# Patient Record
Sex: Male | Born: 1975 | Race: Black or African American | Hispanic: No | Marital: Single
Health system: Midwestern US, Community
[De-identification: ages and names within clinical notes are randomized; demographics above are authoritative.]

---

## 2012-12-30 HISTORY — PX: STOMACH SURGERY: SHX791

## 2015-03-29 ENCOUNTER — Inpatient Hospital Stay: Admit: 2015-03-29 | Primary: Family Medicine

## 2015-03-29 LAB — METABOLIC PANEL, COMPREHENSIVE
A-G Ratio: 1.4 (ref 1.0–3.1)
ALT (SGPT): 29 U/L (ref 12.0–78.0)
AST (SGOT): 44 U/L — ABNORMAL HIGH (ref 15–37)
Albumin: 4.4 g/dL (ref 3.40–5.00)
Alk. phosphatase: 98 U/L (ref 46–116)
Anion gap: 18 mmol/L — ABNORMAL HIGH (ref 10–17)
BUN/Creatinine ratio: 15 (ref 6.0–20.0)
BUN: 17 MG/DL (ref 7–18)
Bilirubin, total: 1.7 MG/DL — ABNORMAL HIGH (ref 0.20–1.00)
CO2: 26 mmol/L (ref 21–32)
Calcium: 9.1 MG/DL (ref 8.5–10.1)
Chloride: 104 mmol/L (ref 98–107)
Creatinine: 1.15 MG/DL (ref 0.6–1.3)
GFR est AA: >60 mL/min/{1.73_m2} (ref >60)
GFR est non-AA: >60 mL/min/{1.73_m2} (ref >60)
Globulin: 3.2 g/dL
Glucose: 154 mg/dL — ABNORMAL HIGH (ref 74–106)
Potassium: 3.1 mmol/L — ABNORMAL LOW (ref 3.50–5.10)
Protein, total: 7.6 g/dL (ref 6.40–8.20)
Sodium: 145 mmol/L (ref 136–145)

## 2015-03-29 LAB — CBC WITH AUTOMATED DIFF
ABS. BASOPHILS: 0 10*3/uL (ref 0.0–0.2)
ABS. EOSINOPHILS: 0 10*3/uL (ref 0.0–0.7)
ABS. LYMPHOCYTES: 0.7 10*3/uL — ABNORMAL LOW (ref 1.2–3.4)
ABS. MONOCYTES: 0.7 10*3/uL — ABNORMAL LOW (ref 1.1–3.2)
ABS. NEUTROPHILS: 7.5 10*3/uL — ABNORMAL HIGH (ref 1.4–6.5)
BASOPHILS: 0 % (ref 0–2)
EOSINOPHILS: 0 % (ref 0–5)
HCT: 47.1 % — ABNORMAL HIGH (ref 36.8–45.2)
HGB: 16.6 g/dL — ABNORMAL HIGH (ref 12.8–15.0)
IMMATURE GRANULOCYTES: 0.2 % (ref 0.0–5.0)
LYMPHOCYTES: 8 % — ABNORMAL LOW (ref 16–40)
MCH: 29.5 PG (ref 27–31)
MCHC: 35.2 g/dL (ref 32–36)
MCV: 83.8 FL (ref 81–99)
MONOCYTES: 8 % (ref 0–12)
MPV: 11.2 FL — ABNORMAL HIGH (ref 7.4–10.4)
NEUTROPHILS: 84 % — ABNORMAL HIGH (ref 40–70)
PLATELET: 247 10*3/uL (ref 140–450)
RBC: 5.62 M/uL — ABNORMAL HIGH (ref 4.0–5.2)
RDW: 13.5 % (ref 11.5–14.5)
WBC: 9 10*3/uL (ref 4.8–10.8)

## 2015-03-29 LAB — AMYLASE: Amylase: 67 U/L (ref 25–115)

## 2015-03-29 LAB — LIPASE: Lipase: 119 U/L (ref 65–230)

## 2016-01-17 ENCOUNTER — Emergency Department (HOSPITAL_COMMUNITY): Payer: Self-pay

## 2016-01-17 ENCOUNTER — Emergency Department (HOSPITAL_COMMUNITY)
Admission: EM | Admit: 2016-01-17 | Discharge: 2016-01-18 | Disposition: A | Discharge status: Home / Self Care | Payer: Self-pay | Attending: Emergency Medicine | Admitting: Emergency Medicine

## 2016-01-17 ENCOUNTER — Encounter (HOSPITAL_COMMUNITY): Payer: Self-pay | Admitting: Emergency Medicine

## 2016-01-17 DIAGNOSIS — R112 Nausea with vomiting, unspecified: Secondary | ICD-10-CM | POA: Insufficient documentation

## 2016-01-17 DIAGNOSIS — Z9889 Other specified postprocedural states: Secondary | ICD-10-CM | POA: Insufficient documentation

## 2016-01-17 DIAGNOSIS — R1084 Generalized abdominal pain: Secondary | ICD-10-CM | POA: Insufficient documentation

## 2016-01-17 DIAGNOSIS — R197 Diarrhea, unspecified: Secondary | ICD-10-CM | POA: Insufficient documentation

## 2016-01-17 LAB — CBC
HEMATOCRIT: 41.3 % (ref 39.0–52.0)
HEMOGLOBIN: 14.1 g/dL (ref 13.0–17.0)
MCH: 29.1 pg (ref 26.0–34.0)
MCHC: 34.1 g/dL (ref 30.0–36.0)
MCV: 85.2 fL (ref 78.0–100.0)
PLATELETS: 205 10*3/uL (ref 150–400)
RBC: 4.85 MIL/uL (ref 4.22–5.81)
RDW: 14.4 % (ref 11.5–15.5)
WBC: 8.5 10*3/uL (ref 4.0–10.5)

## 2016-01-17 LAB — COMPREHENSIVE METABOLIC PANEL
ALT: 17 U/L (ref 17–63)
ANION GAP: 11 (ref 5–15)
AST: 17 U/L (ref 15–41)
Albumin: 3.9 g/dL (ref 3.5–5.0)
Alkaline Phosphatase: 71 U/L (ref 38–126)
BUN: 10 mg/dL (ref 6–20)
CHLORIDE: 104 mmol/L (ref 101–111)
CO2: 26 mmol/L (ref 22–32)
CREATININE: 1.13 mg/dL (ref 0.61–1.24)
Calcium: 9.3 mg/dL (ref 8.9–10.3)
Glucose, Bld: 162 mg/dL — ABNORMAL HIGH (ref 65–99)
POTASSIUM: 3.5 mmol/L (ref 3.5–5.1)
SODIUM: 141 mmol/L (ref 135–145)
Total Bilirubin: 1 mg/dL (ref 0.3–1.2)
Total Protein: 6.8 g/dL (ref 6.5–8.1)

## 2016-01-17 LAB — LIPASE, BLOOD: LIPASE: 23 U/L (ref 11–51)

## 2016-01-17 MED ORDER — FAMOTIDINE IN NACL 20-0.9 MG/50ML-% IV SOLN
20.0000 mg | Freq: Once | INTRAVENOUS | Status: AC
  Administered 2016-01-17: 20 mg via INTRAVENOUS
  Filled 2016-01-17: qty 50

## 2016-01-17 MED ORDER — DICYCLOMINE HCL 10 MG/ML IM SOLN
20.0000 mg | Freq: Once | INTRAMUSCULAR | Status: AC
  Administered 2016-01-17: 20 mg via INTRAMUSCULAR
  Filled 2016-01-17: qty 2

## 2016-01-17 MED ORDER — PROMETHAZINE HCL 25 MG/ML IJ SOLN
12.5000 mg | Freq: Once | INTRAMUSCULAR | Status: AC
  Administered 2016-01-18: 12.5 mg via INTRAVENOUS
  Filled 2016-01-17: qty 1

## 2016-01-17 MED ORDER — SODIUM CHLORIDE 0.9 % IV BOLUS (SEPSIS)
1000.0000 mL | Freq: Once | INTRAVENOUS | Status: AC
  Administered 2016-01-17: 1000 mL via INTRAVENOUS

## 2016-01-17 MED ORDER — ONDANSETRON 4 MG PO TBDP: 4.0000 mg | ORAL_TABLET | Freq: Once | ORAL | Status: DC | PRN

## 2016-01-17 MED ORDER — FENTANYL CITRATE (PF) 100 MCG/2ML IJ SOLN
50.0000 ug | INTRAMUSCULAR | Status: AC | PRN
  Administered 2016-01-17 – 2016-01-18 (×2): 50 ug via INTRAVENOUS
  Filled 2016-01-17 (×2): qty 2

## 2016-01-17 MED ORDER — ONDANSETRON HCL 4 MG/2ML IJ SOLN
4.0000 mg | INTRAMUSCULAR | Status: DC | PRN
  Administered 2016-01-17: 4 mg via INTRAVENOUS
  Filled 2016-01-17: qty 2

## 2016-01-17 NOTE — ED Notes (Signed)
Dr. Mcmanus at the bedside.  

## 2016-01-17 NOTE — ED Provider Notes (Signed)
CSN: [id number]     Arrival date & time 01/17/16  2212 History   First MD Initiated Contact with Patient 01/17/16 2213     Chief Complaint  Patient presents with  . Abdominal Pain      HPI Pt was seen at 2215.  Per pt, c/o gradual onset and persistence of constant generalized abd "pain" since this morning.  Has been associated with multiple intermittent episodes of N/V/D.  Describes the abd pain as "cramping."  Denies fevers, no back pain, no rash, no CP/SOB, no black or blood in stools or emesis.      History reviewed. No pertinent past medical history.   Past Surgical History  Procedure Laterality Date  . Stomach surgery  2014    patient is unsure what kind    Social History  Substance Use Topics  . Smoking status: Never Smoker   . Smokeless tobacco: Never Used  . Alcohol Use: No    Review of Systems ROS: Statement: All systems negative except as marked or noted in the HPI; Constitutional: Negative for fever and chills. ; ; Eyes: Negative for eye pain, redness and discharge. ; ; ENMT: Negative for ear pain, hoarseness, nasal congestion, sinus pressure and sore throat. ; ; Cardiovascular: Negative for chest pain, palpitations, diaphoresis, dyspnea and peripheral edema. ; ; Respiratory: Negative for cough, wheezing and stridor. ; ; Gastrointestinal: +N/V/D, abd pain. Negative for blood in stool, hematemesis, jaundice and rectal bleeding. . ; ; Genitourinary: Negative for dysuria, flank pain and hematuria. ; ; Musculoskeletal: Negative for back pain and neck pain. Negative for swelling and trauma.; ; Skin: Negative for pruritus, rash, abrasions, blisters, bruising and skin lesion.; ; Neuro: Negative for headache, lightheadedness and neck stiffness. Negative for weakness, altered level of consciousness , altered mental status, extremity weakness, paresthesias, involuntary movement, seizure and syncope.      Allergies  Review of patient's allergies indicates no known allergies.  Home  Medications   Prior to Admission medications   Not on File   BP 151/87 mmHg  Pulse 76  Temp(Src) 98.4 F (36.9 C)  Resp 16  Ht  (1.905 m)  Wt 175 lb (79.379 kg)  BMI 21.87 kg/m2  SpO2 100% Physical Exam  2220: Physical examination:  Nursing notes reviewed; Vital signs and O2 SAT reviewed;  Constitutional: Well developed, Well nourished, Uncomfortable appearing.; Head:  Normocephalic, atraumatic; Eyes: EOMI, PERRL, No scleral icterus; ENMT: Mouth and pharynx normal, Mucous membranes dry; Neck: Supple, Full range of motion, No lymphadenopathy; Cardiovascular: Regular rate and rhythm, No gallop; Respiratory: Breath sounds clear & equal bilaterally, No wheezes.  Speaking full sentences with ease, Normal respiratory effort/excursion; Chest: Nontender, Movement normal; Abdomen: Soft, +dry heaving into emesis bag during exam. +diffuse tenderness to palp. No rebound or guarding. Nondistended, Normal bowel sounds; Genitourinary: No CVA tenderness; Extremities: Pulses normal, No tenderness, No edema, No calf edema or asymmetry.; Neuro: AA&Ox3, Major CN grossly intact.  Speech clear. No gross focal motor or sensory deficits in extremities.; Skin: Color normal, Warm, Dry.   ED Course  Procedures (including critical care time)  Labs Review Imaging Review  I have personally reviewed and evaluated these images and lab results as part of my medical decision-making.   EKG Interpretation None      MDM  MDM Reviewed: nursing note and vitals Interpretation: labs, x-ray and CT scan     Results for orders placed or performed during the hospital encounter of 01/17/16  Lipase, blood  Result Value  Ref Range   Lipase 23 11 - 51 U/L  Comprehensive metabolic panel  Result Value Ref Range   Sodium 141 135 - 145 mmol/L   Potassium 3.5 3.5 - 5.1 mmol/L   Chloride 104 101 - 111 mmol/L   CO2 26 22 - 32 mmol/L   Glucose, Bld 162 (H) 65 - 99 mg/dL   BUN 10 6 - 20 mg/dL   Creatinine, Ser 4.54  0.61 - 1.24 mg/dL   Calcium 9.3 8.9 - 09.8 mg/dL   Total Protein 6.8 6.5 - 8.1 g/dL   Albumin 3.9 3.5 - 5.0 g/dL   AST 17 15 - 41 U/L   ALT 17 17 - 63 U/L   Alkaline Phosphatase 71 38 - 126 U/L   Total Bilirubin 1.0 0.3 - 1.2 mg/dL   GFR calc non Af Amer >60 >60 mL/min   GFR calc Af Amer >60 >60 mL/min   Anion gap 11 5 - 15  CBC  Result Value Ref Range   WBC 8.5 4.0 - 10.5 K/uL   RBC 4.85 4.22 - 5.81 MIL/uL   Hemoglobin 14.1 13.0 - 17.0 g/dL   HCT 11.9 14.7 - 82.9 %   MCV 85.2 78.0 - 100.0 fL   MCH 29.1 26.0 - 34.0 pg   MCHC 34.1 30.0 - 36.0 g/dL   RDW 56.2 13.0 - 86.5 %   Platelets 205 150 - 400 K/uL   Dg Chest 2 View 01/17/2016  CLINICAL DATA:  40 year old male with abdominal pain. No chest complaint. EXAM: CHEST  2 VIEW COMPARISON:  None. FINDINGS: The heart size and mediastinal contours are within normal limits. Both lungs are clear. The visualized skeletal structures are unremarkable. IMPRESSION: No active cardiopulmonary disease. Electronically Signed   By: Elgie Collard M.D.   On: 01/17/2016 23:13   Ct Abdomen Pelvis W Contrast 01/18/2016  CLINICAL DATA:  40 year old male with nausea and vomiting and chills. Diffuse abdominal pain. EXAM: CT ABDOMEN AND PELVIS WITH CONTRAST TECHNIQUE: Multidetector CT imaging of the abdomen and pelvis was performed using the standard protocol following bolus administration of intravenous contrast. CONTRAST:  OMNIPAQUE IOHEXOL 300 MG/ML  SOLN COMPARISON:  None FINDINGS: Evaluation of this exam is limited due to respiratory motion artifact. The visualized lung bases are clear. No intra-abdominal free air or free fluid. The liver, gallbladder, pancreas, spleen, adrenal glands, kidneys, visualized ureters, and urinary bladder appear unremarkable. The prostate and seminal vesicles are grossly unremarkable. Loose stool noted within the colon compatible with diarrheal state. Correlation with clinical exam and stool cultures recommended. Multiple  normal caliber fluid-filled loops of small bowel noted which may be physiologic or represent enteritis. No evidence of bowel obstruction. Normal appendix. The abdominal aorta and IVC appear unremarkable. No portal venous gas identified. There is no adenopathy. The abdominal wall soft tissues and the osseous structures appear unremarkable. IMPRESSION: Possible enteritis with diarrheal state. Correlation with clinical exam and stool cultures recommended. No evidence of bowel obstruction. Normal appendix. Electronically Signed   By: Elgie Collard M.D.   On: 01/18/2016 00:35    0050:  Pt has continued to dry heave despite IV zofran. IV phenergan given, and pt is now sleeping, appears comfortable. No stooling while in the ED. VS remain stable. Pending UA and PO trial. Sign out to PA Neva Seat.   Samuel Jester, DO 01/20/16 2330

## 2016-01-17 NOTE — ED Notes (Signed)
Explained to patient nausea medication comes from pharmacy. Prepared to go to ct scan

## 2016-01-17 NOTE — ED Notes (Signed)
Per EMS, reports of nausea, vomiting, diarrhea, and cramping in abdomen. Chills, and diaphoretic. 18g in left arm.  zofran given by ems. Stomach surgery in 2014, unclear of what type. VS with ems bp 142/80, p 64, 96% o2 on room air,cbg 141.

## 2016-01-18 ENCOUNTER — Encounter (HOSPITAL_COMMUNITY): Payer: Self-pay

## 2016-01-18 LAB — URINALYSIS, ROUTINE W REFLEX MICROSCOPIC
Bilirubin Urine: NEGATIVE
Glucose, UA: 100 mg/dL — AB
HGB URINE DIPSTICK: NEGATIVE
KETONES UR: NEGATIVE mg/dL
LEUKOCYTES UA: NEGATIVE
Nitrite: NEGATIVE
PROTEIN: NEGATIVE mg/dL
Specific Gravity, Urine: 1.042 — ABNORMAL HIGH (ref 1.005–1.030)
pH: 7 (ref 5.0–8.0)

## 2016-01-18 MED ORDER — HYDROCODONE-ACETAMINOPHEN 5-325 MG PO TABS: 1.0000 | ORAL_TABLET | ORAL | Status: AC | PRN

## 2016-01-18 MED ORDER — IOHEXOL 300 MG/ML  SOLN
100.0000 mL | Freq: Once | INTRAMUSCULAR | Status: AC | PRN
  Administered 2016-01-18: 100 mL via INTRAVENOUS

## 2016-01-18 MED ORDER — ONDANSETRON HCL 4 MG PO TABS: 4.0000 mg | ORAL_TABLET | Freq: Four times a day (QID) | ORAL | Status: AC

## 2016-01-18 NOTE — ED Provider Notes (Signed)
Patient signout from Dr. Clarene Duke, he came in today with complex of nausea, vomiting, diarrhea and cramping to his abdomen. He's had a negative urinalysis, negative lipase, normal CMP and CBC as well as a negative chest x-ray and a CT abdomen pelvis showing enteritis.  The patient was planning of nausea still after the IV Zofran and therefore given a dose of IV Phenergan which made him very sleepy. Therefore the patient was observed until more awake. After the patient became less drowsy attempted to have him by mouth challenge twice but he said he was nauseous. He said that he fell acutely to vomit, attempted hand him the vomit back but instead he he acted like he is given a vomit on the floor instead, I tempted to give it a vomit bag 2 but instead he got on all fours on the ground to vomit. There was only dry heaving and did not see any vomitus.  I allowed the patient to rest for another hour and asked the nurse to attempt to fluid challenge him, the patient does not want to participate in the fluid challenge, has pulled off all of his equipment. At this time he will be discharged with referral to gastroenterology, pain and nausea medicine. He has not had any episodes of stool while in the ED today and has not had any episodes of vomiting per nurse in hours.  Marlon Pel, PA-C 01/18/16 0248  Samuel Jester, DO 01/20/16 2330

## 2016-01-18 NOTE — Discharge Instructions (Signed)
Diarrhea °Diarrhea is frequent loose and watery bowel movements. It can cause you to feel weak and dehydrated. Dehydration can cause you to become tired and thirsty, have a dry mouth, and have decreased urination that often is dark yellow. Diarrhea is a sign of another problem, most often an infection that will not last long. In most cases, diarrhea typically lasts 2-3 days. However, it can last longer if it is a sign of something more serious. It is important to treat your diarrhea as directed by your caregiver to lessen or prevent future episodes of diarrhea. °CAUSES  °Some common causes include: °· Gastrointestinal infections caused by viruses, bacteria, or parasites. °· Food poisoning or food allergies. °· Certain medicines, such as antibiotics, chemotherapy, and laxatives. °· Artificial sweeteners and fructose. °· Digestive disorders. °HOME CARE INSTRUCTIONS °· Ensure adequate fluid intake (hydration): Have 1 cup (8 oz) of fluid for each diarrhea episode. Avoid fluids that contain simple sugars or sports drinks, fruit juices, whole milk products, and sodas. Your urine should be clear or pale yellow if you are drinking enough fluids. Hydrate with an oral rehydration solution that you can purchase at pharmacies, retail stores, and online. You can prepare an oral rehydration solution at home by mixing the following ingredients together: °¨  - tsp table salt. °¨ ¾ tsp baking soda. °¨  tsp salt substitute containing potassium chloride. °¨ 1  tablespoons sugar. °¨ 1 L (34 oz) of water. °· Certain foods and beverages may increase the speed at which food moves through the gastrointestinal (GI) tract. These foods and beverages should be avoided and include: °¨ Caffeinated and alcoholic beverages. °¨ High-fiber foods, such as raw fruits and vegetables, nuts, seeds, and whole grain breads and cereals. °¨ Foods and beverages sweetened with sugar alcohols, such as xylitol, sorbitol, and mannitol. °· Some foods may be well  tolerated and may help thicken stool including: °¨ Starchy foods, such as rice, toast, pasta, low-sugar cereal, oatmeal, grits, baked potatoes, crackers, and bagels. °¨ Bananas. °¨ Applesauce. °· Add probiotic-rich foods to help increase healthy bacteria in the GI tract, such as yogurt and fermented milk products. °· Wash your hands well after each diarrhea episode. °· Only take over-the-counter or prescription medicines as directed by your caregiver. °· Take a warm bath to relieve any burning or pain from frequent diarrhea episodes. °SEEK IMMEDIATE MEDICAL CARE IF:  °· You are unable to keep fluids down. °· You have persistent vomiting. °· You have blood in your stool, or your stools are black and tarry. °· You do not urinate in 6-8 hours, or there is only a small amount of very dark urine. °· You have abdominal pain that increases or localizes. °· You have weakness, dizziness, confusion, or light-headedness. °· You have a severe headache. °· Your diarrhea gets worse or does not get better. °· You have a fever or persistent symptoms for more than 2-3 days. °· You have a fever and your symptoms suddenly get worse. °MAKE SURE YOU:  °· Understand these instructions. °· Will watch your condition. °· Will get help right away if you are not doing well or get worse. °  °This information is not intended to replace advice given to you by your health care provider. Make sure you discuss any questions you have with your health care provider. °  °Document Released: 12/06/2002 Document Revised: 01/06/2015 Document Reviewed: 08/23/2012 °Elsevier Interactive Patient Education ©2016 [employer] °Abdominal Pain, Adult °Many things can cause abdominal pain. Usually, abdominal pain   is not caused by a disease and will improve without treatment. It can often be observed and treated at home. Your health care provider will do a physical exam and possibly order blood tests and X-rays to help determine the seriousness of your pain.  However, in many cases, more time must pass before a clear cause of the pain can be found. Before that point, your health care provider may not know if you need more testing or further treatment. °HOME CARE INSTRUCTIONS °Monitor your abdominal pain for any changes. The following actions may help to alleviate any discomfort you are experiencing: °· Only take over-the-counter or prescription medicines as directed by your health care provider. °· Do not take laxatives unless directed to do so by your health care provider. °· Try a clear liquid diet (broth, tea, or water) as directed by your health care provider. Slowly move to a bland diet as tolerated. °SEEK MEDICAL CARE IF: °· You have unexplained abdominal pain. °· You have abdominal pain associated with nausea or diarrhea. °· You have pain when you urinate or have a bowel movement. °· You experience abdominal pain that wakes you in the night. °· You have abdominal pain that is worsened or improved by eating food. °· You have abdominal pain that is worsened with eating fatty foods. °· You have a fever. °SEEK IMMEDIATE MEDICAL CARE IF: °· Your pain does not go away within 2 hours. °· You keep throwing up (vomiting). °· Your pain is felt only in portions of the abdomen, such as the right side or the left lower portion of the abdomen. °· You pass bloody or black tarry stools. °MAKE SURE YOU: °· Understand these instructions. °· Will watch your condition. °· Will get help right away if you are not doing well or get worse. °  °This information is not intended to replace advice given to you by your health care provider. Make sure you discuss any questions you have with your health care provider. °  °Document Released: 09/25/2005 Document Revised: 09/06/2015 Document Reviewed: 08/25/2013 °Elsevier Interactive Patient Education ©2016 Elsevier Inc. ° °

## 2016-01-18 NOTE — ED Notes (Signed)
Dr. Ron Parker at the bedside. Urine collected.

## 2016-01-18 NOTE — ED Notes (Signed)
Woke patient up and provided ginger ale again, encouraged to consume as PO challenge, and to not go back asleep.

## 2016-01-18 NOTE — ED Notes (Signed)
Discussed need with urine sample, straight cath next if unable to void. Urinal provided.

## 2016-01-18 NOTE — ED Notes (Signed)
Patient has not consumed much ginger ale. Inquired how he could possibly return home, he reports someone can come pick him up. Offered alternate drink, but patient does not acknowledge. Laying in bed with monitoring equipment pulled off.

## 2016-01-18 NOTE — ED Notes (Signed)
Patient declines bus pass when offered, "I don't ride no bus". He reports he cannot remember his friends phone number as he has left his cellphone at home. Discharged to the waiting room.

## 2016-01-18 NOTE — ED Notes (Signed)
Provided ginger ale as po challenge, patient not interested in participating in plan of care. Doesn't acknowledge drink.

## 2016-12-10 ENCOUNTER — Inpatient Hospital Stay: Admit: 2016-12-10 | Primary: Family Medicine

## 2016-12-10 LAB — METABOLIC PANEL, COMPREHENSIVE
A-G Ratio: 1.2 (ref 1.0–3.1)
ALT (SGPT): 49 U/L (ref 12.0–78.0)
AST (SGOT): 53 U/L — ABNORMAL HIGH (ref 15–37)
Albumin: 5.1 g/dL — ABNORMAL HIGH (ref 3.40–5.00)
Alk. phosphatase: 94 U/L (ref 46–116)
Anion gap: 14 mmol/L (ref 10–17)
BUN/Creatinine ratio: 19 (ref 6.0–20.0)
BUN: 30 MG/DL — ABNORMAL HIGH (ref 7–18)
Bilirubin, total: 3.4 MG/DL — ABNORMAL HIGH (ref 0.20–1.00)
CO2: 31 mmol/L (ref 21–32)
Calcium: 10.5 MG/DL — ABNORMAL HIGH (ref 8.5–10.1)
Chloride: 94 mmol/L — ABNORMAL LOW (ref 98–107)
Creatinine: 1.55 MG/DL — ABNORMAL HIGH (ref 0.6–1.3)
GFR est AA: >60 mL/min/{1.73_m2} (ref >60)
GFR est non-AA: 53 mL/min/{1.73_m2} — ABNORMAL LOW (ref >60)
Globulin: 4.2 g/dL
Glucose: 139 mg/dL — ABNORMAL HIGH (ref 74–106)
Potassium: 4.1 mmol/L (ref 3.50–5.10)
Protein, total: 9.3 g/dL — ABNORMAL HIGH (ref 6.40–8.20)
Sodium: 135 mmol/L — ABNORMAL LOW (ref 136–145)

## 2016-12-10 LAB — CBC WITH AUTOMATED DIFF
ABS. BASOPHILS: 0 10*3/uL (ref 0.0–0.2)
ABS. EOSINOPHILS: 0 10*3/uL (ref 0.0–0.7)
ABS. LYMPHOCYTES: 1.3 10*3/uL (ref 1.2–3.4)
ABS. MONOCYTES: 0.8 10*3/uL — ABNORMAL LOW (ref 1.1–3.2)
ABS. NEUTROPHILS: 6.7 10*3/uL — ABNORMAL HIGH (ref 1.4–6.5)
BASOPHILS: 0 % (ref 0–2)
EOSINOPHILS: 0 % (ref 0–5)
HCT: 52 % — ABNORMAL HIGH (ref 36.8–45.2)
HGB: 18.4 g/dL — CR (ref 12.8–15.2)
IMMATURE GRANULOCYTES: 0 % (ref 0.0–5.0)
LYMPHOCYTES: 14 % — ABNORMAL LOW (ref 16–40)
MCH: 29.9 PG (ref 27–31)
MCHC: 35.4 g/dL (ref 32–36)
MCV: 84.4 FL (ref 81–99)
MONOCYTES: 9 % (ref 0–12)
MPV: 10.7 FL — ABNORMAL HIGH (ref 7.4–10.4)
NEUTROPHILS: 77 % — ABNORMAL HIGH (ref 40–70)
PLATELET: 248 10*3/uL (ref 140–450)
RBC: 6.16 M/uL — ABNORMAL HIGH (ref 4.0–5.2)
RDW: 13.2 % (ref 11.5–14.5)
WBC: 8.9 10*3/uL (ref 4.8–10.8)

## 2016-12-10 LAB — BILIRUBIN, DIRECT: Bilirubin, direct: 0.5 MG/DL — ABNORMAL HIGH (ref 0.0–0.20)

## 2016-12-10 LAB — BILIRUBIN,INDIRECT: Bilirubin, indirect: 2.9 MG/DL — ABNORMAL HIGH (ref 0.0–0.4)

## 2016-12-10 LAB — LIPASE: Lipase: 123 U/L (ref 65–230)

## 2016-12-10 LAB — AMYLASE: Amylase: 71 U/L (ref 25–115)

## 2017-02-23 DIAGNOSIS — J1100 Influenza due to unidentified influenza virus with unspecified type of pneumonia: Secondary | ICD-10-CM

## 2017-02-24 ENCOUNTER — Inpatient Hospital Stay: Admit: 2017-02-24 | Discharge: 2017-02-24 | Attending: Specialist

## 2017-02-24 ENCOUNTER — Inpatient Hospital Stay
Admit: 2017-02-24 | Discharge: 2017-03-12 | Payer: MEDICAID | Source: Other Acute Inpatient Hospital | Attending: Infectious Disease | Admitting: Infectious Disease

## 2017-02-24 ENCOUNTER — Inpatient Hospital Stay: Admit: 2017-02-24 | Payer: MEDICAID | Primary: Family Medicine

## 2017-02-24 LAB — METABOLIC PANEL, COMPREHENSIVE
A-G Ratio: 0.8 — ABNORMAL LOW (ref 1.0–3.1)
ALT (SGPT): 113 U/L — ABNORMAL HIGH (ref 12.0–78.0)
AST (SGOT): 131 U/L — ABNORMAL HIGH (ref 15–37)
Albumin: 3.1 g/dL — ABNORMAL LOW (ref 3.40–5.00)
Alk. phosphatase: 86 U/L (ref 46–116)
Anion gap: 17 mmol/L (ref 10–17)
BUN/Creatinine ratio: 11 (ref 6.0–20.0)
BUN: 14 MG/DL (ref 7–18)
Bilirubin, total: 2.1 MG/DL — ABNORMAL HIGH (ref 0.20–1.00)
CO2: 26 mmol/L (ref 21–32)
Calcium: 8.7 MG/DL (ref 8.5–10.1)
Chloride: 99 mmol/L (ref 98–107)
Creatinine: 1.23 MG/DL (ref 0.6–1.3)
GFR est AA: >60 mL/min/{1.73_m2} (ref >60)
GFR est non-AA: >60 mL/min/{1.73_m2} (ref >60)
Globulin: 3.7 g/dL
Glucose: 143 mg/dL — ABNORMAL HIGH (ref 74–106)
Potassium: 4.4 mmol/L (ref 3.50–5.10)
Protein, total: 6.8 g/dL (ref 6.40–8.20)
Sodium: 138 mmol/L (ref 136–145)

## 2017-02-24 LAB — CBC WITH AUTOMATED DIFF
ABS. BASOPHILS: 0 10*3/uL (ref 0.0–0.2)
ABS. EOSINOPHILS: 0.2 10*3/uL (ref 0.0–0.7)
ABS. LYMPHOCYTES: 0.4 10*3/uL — ABNORMAL LOW (ref 1.2–3.4)
ABS. MONOCYTES: 0.5 10*3/uL — ABNORMAL LOW (ref 1.1–3.2)
ABS. NEUTROPHILS: 6.9 10*3/uL — ABNORMAL HIGH (ref 1.4–6.5)
BASOPHILS: 0 % (ref 0–2)
EOSINOPHILS: 3 % (ref 0–5)
HCT: 45.8 % — ABNORMAL HIGH (ref 36.8–45.2)
HGB: 16.2 g/dL — ABNORMAL HIGH (ref 12.8–15.0)
IMMATURE GRANULOCYTES: 1 % (ref 0.0–5.0)
LYMPHOCYTES: 5 % — ABNORMAL LOW (ref 16–40)
MCH: 29.2 PG (ref 27–31)
MCHC: 35.4 g/dL (ref 32–36)
MCV: 82.5 FL (ref 81–99)
MONOCYTES: 7 % (ref 0–12)
MPV: 11.3 FL — ABNORMAL HIGH (ref 7.4–10.4)
NEUTROPHILS: 85 % — ABNORMAL HIGH (ref 40–70)
PLATELET: 107 10*3/uL — ABNORMAL LOW (ref 140–450)
RBC: 5.55 M/uL — ABNORMAL HIGH (ref 4.0–5.2)
RDW: 13.1 % (ref 11.5–14.5)
WBC: 8.1 10*3/uL (ref 4.8–10.8)

## 2017-02-24 LAB — EKG, 12 LEAD, SUBSEQUENT
Atrial Rate: 101 {beats}/min
Calculated P Axis: 59 degrees
Calculated R Axis: 43 degrees
Calculated T Axis: 48 degrees
P-R Interval: 116 ms
Q-T Interval: 368 ms
QRS Duration: 74 ms
QTC Calculation (Bezet): 477 ms
Ventricular Rate: 101 {beats}/min

## 2017-02-24 LAB — URINALYSIS W/ RFLX MICROSCOPIC
Bilirubin: NEGATIVE
Glucose: 100 mg/dL — AB
Leukocyte Esterase: NEGATIVE
Nitrites: NEGATIVE
Protein: 100 mg/dL — AB
Specific gravity: 1.028
Urobilinogen: 1 EU/dL (ref 0.2–1.0)
pH (UA): 5.5 (ref 5.0–7.0)

## 2017-02-24 LAB — TROPONIN I: Troponin-I, Qt.: <0.02 ng/mL (ref 0.00–0.08)

## 2017-02-24 LAB — EKG, 12 LEAD, INITIAL
Atrial Rate: 119 {beats}/min
Calculated P Axis: 46 degrees
Calculated R Axis: 50 degrees
Calculated T Axis: 0 degrees
P-R Interval: 152 ms
Q-T Interval: 320 ms
QRS Duration: 76 ms
QTC Calculation (Bezet): 450 ms
Ventricular Rate: 119 {beats}/min

## 2017-02-24 LAB — CK: CK: 1171 U/L — ABNORMAL HIGH (ref 26–308)

## 2017-02-24 LAB — URINE MICROSCOPIC
Casts: NONE SEEN /lpf
Crystals, urine: NONE SEEN /LPF
WBC: NEGATIVE /hpf

## 2017-02-24 LAB — PROTHROMBIN TIME + INR
INR: 1.6 — ABNORMAL HIGH (ref 0.9–1.2)
Prothrombin time: 17.1 s — ABNORMAL HIGH (ref 9.8–12.0)

## 2017-02-24 LAB — TSH 3RD GENERATION: TSH: 0.75 u[IU]/mL (ref 0.55–7.78)

## 2017-02-24 LAB — PTT: aPTT: 33.6 s — ABNORMAL HIGH (ref 24.5–31.6)

## 2017-02-24 LAB — HIV 1/2 SCREEN - L & D EXPOSURE
HIV RAPID SCR 1/2,RHIV12: NEGATIVE
HIV-1/2 rapid screen: NEGATIVE

## 2017-02-24 LAB — LACTIC ACID: Lactic acid: 2.3 MMOL/L — ABNORMAL HIGH (ref 0.4–2.0)

## 2017-02-24 LAB — EKG 12-LEAD
Atrial Rate: 101 {beats}/min
Atrial Rate: 119 {beats}/min
P Axis: 46 degrees
P Axis: 59 degrees
P-R Interval: 116 ms
P-R Interval: 152 ms
Q-T Interval: 320 ms
Q-T Interval: 368 ms
QRS Duration: 74 ms
QRS Duration: 76 ms
QTc Calculation (Bazett): 450 ms
QTc Calculation (Bazett): 477 ms
R Axis: 43 degrees
R Axis: 50 degrees
T Axis: 0 degrees
T Axis: 48 degrees
Ventricular Rate: 101 {beats}/min
Ventricular Rate: 119 {beats}/min

## 2017-02-24 MED ORDER — OSELTAMIVIR PHOSPHATE 75 MG CAP
75 mg | Freq: Two times a day (BID) | ORAL | Status: DC
Start: 2017-02-24 — End: 2017-03-02
  Administered 2017-02-24 – 2017-03-02 (×14): via ORAL

## 2017-02-24 MED ORDER — PIPERACILLIN-TAZOBACTAM-DEXTROSE (ISO) 3.375 GRAM/50 ML IV PIGGY BACK
3.375 gram/50 mL | Freq: Three times a day (TID) | INTRAVENOUS | Status: DC
Start: 2017-02-24 — End: 2017-02-24

## 2017-02-24 MED ORDER — VANCOMYCIN 10 GRAM IV SOLR
10 gram | Freq: Two times a day (BID) | INTRAVENOUS | Status: DC
Start: 2017-02-24 — End: 2017-02-26
  Administered 2017-02-25 – 2017-02-26 (×5): via INTRAVENOUS

## 2017-02-24 MED ORDER — ACETAMINOPHEN 325 MG TABLET
325 mg | ORAL | Status: DC | PRN
Start: 2017-02-24 — End: 2017-02-24
  Administered 2017-02-24 – 2017-02-25 (×2): via ORAL

## 2017-02-24 MED ORDER — PIPERACILLIN-TAZOBACTAM 3.375 GRAM IV SOLR
3.375 gram | Freq: Three times a day (TID) | INTRAVENOUS | Status: DC
Start: 2017-02-24 — End: 2017-02-26
  Administered 2017-02-24 – 2017-02-26 (×7): via INTRAVENOUS

## 2017-02-24 MED ORDER — SENNOSIDES 8.6 MG TAB
8.6 mg | Freq: Every day | ORAL | Status: DC
Start: 2017-02-24 — End: 2017-03-12
  Administered 2017-02-24 – 2017-03-12 (×14): via ORAL

## 2017-02-24 MED ORDER — ENOXAPARIN 40 MG/0.4 ML SUB-Q SYRINGE
40 mg/0.4 mL | SUBCUTANEOUS | Status: DC
Start: 2017-02-24 — End: 2017-02-24
  Administered 2017-02-24: 10:00:00 via SUBCUTANEOUS

## 2017-02-24 MED ORDER — PHARMACY VANCOMYCIN NOTE
Status: DC | PRN
Start: 2017-02-24 — End: 2017-03-05

## 2017-02-24 MED ORDER — DEXTROSE 5% IN NORMAL SALINE IV
INTRAVENOUS | Status: DC
Start: 2017-02-24 — End: 2017-03-12
  Administered 2017-02-24 – 2017-03-12 (×18): via INTRAVENOUS

## 2017-02-24 MED ORDER — ENOXAPARIN 40 MG/0.4 ML SUB-Q SYRINGE
40 mg/0.4 mL | SUBCUTANEOUS | Status: DC
Start: 2017-02-24 — End: 2017-03-12
  Administered 2017-02-25 – 2017-03-11 (×15): via SUBCUTANEOUS

## 2017-02-24 MED ORDER — SODIUM CHLORIDE 0.9 % IV
10 gram | Freq: Once | INTRAVENOUS | Status: AC
Start: 2017-02-24 — End: 2017-02-27
  Administered 2017-02-24: 14:00:00 via INTRAVENOUS

## 2017-02-24 MED FILL — TYLENOL 325 MG TABLET: 325 mg | ORAL | Qty: 2

## 2017-02-24 MED FILL — VANCOMYCIN 1,000 MG IV SOLR: 1000 mg | INTRAVENOUS | Qty: 1500

## 2017-02-24 MED FILL — TAMIFLU 75 MG CAPSULE: 75 mg | ORAL | Qty: 1

## 2017-02-24 MED FILL — DEXTROSE 5% IN NORMAL SALINE IV: INTRAVENOUS | Qty: 1000

## 2017-02-24 MED FILL — PIPERACILLIN-TAZOBACTAM 3.375 GRAM IV SOLR: 3.375 gram | INTRAVENOUS | Qty: 3.38

## 2017-02-24 MED FILL — PHARMACY VANCOMYCIN NOTE: Qty: 1

## 2017-02-24 MED FILL — SENNA LAX 8.6 MG TABLET: 8.6 mg | ORAL | Qty: 1

## 2017-02-24 MED FILL — VANCOMYCIN 5 GRAM IV SOLR: 5 gram | INTRAVENOUS | Qty: 1250

## 2017-02-24 MED FILL — ENOXAPARIN 40 MG/0.4 ML SUB-Q SYRINGE: 40 mg/0.4 mL | SUBCUTANEOUS | Qty: 0.4

## 2017-02-24 NOTE — H&P (Signed)
History and Physical Note      Patient: William Vega               Sex: male          DOA: 02/23/2017         Date of Birth:  December 03, 1976      Age:  41 y.o.        LOS:  LOS: 1 day              Assessment/Plan     Principal Problem:    Influenza (02/23/2017)    Active Problems:    CAP (community acquired pneumonia) (02/23/2017)      Plan: Droplet isolation   Tamiflu, Vancomycin, and Zosyn   Symptom management.     I have discussed the diagnosis and the treatment plan as well as the prognosis with the patient.  The patient has asked questions and were answered.  I have discussed the type and duration medications to be administered. Potential side effects has been discussed as well. Advised to quit smoking, use of illegal drugs and advised to be compliant with meds and follow ups. Risks including death has been explained.    History obtained from:  Patient and chart review.    HPI:  William Vega is a 41 y.o. year-old BLACK [place] male with a complicated past medical history mentioned above was sent to ER for evaluation of dry cough, CP and feverof two day duration. As per patient, he flu like symptoms the last few days. He admitted taking his vaccination. He started coughing more and was productive and the phlegm was bloody. [facility] scan showed diffuse BL pneumonia. Rapid influenza test was +. 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     Past Medical History:  Patient Active Problem List   Diagnosis Code   ??? Influenza J11.1   ??? CAP (community acquired pneumonia) J18.9       Past Surgical History:  No past surgical history on file.    Family History:  No family history on file.    Social History:  Social History     Social History   ??? Marital status: SINGLE     Spouse name: N/A   ??? Number of children: N/A   ??? Years of education: N/A     Social History Main Topics   ??? Smoking status: Not on file   ??? Smokeless tobacco: Not on file    ??? Alcohol use Not on file   ??? Drug use: Not on file   ??? Sexual activity: Not on file     Other Topics Concern   ??? Not on file     Social History Narrative       Current Medications:  Current Facility-Administered Medications   Medication Dose Route Frequency   ??? dextrose 5% and 0.9% NaCl infusion  100 mL/hr IntraVENous CONTINUOUS   ??? acetaminophen (TYLENOL) tablet 650 mg  650 mg Oral Q4H PRN   ??? senna (SENOKOT) tablet 8.6 mg  1 Tab Oral DAILY   ??? vancomycin (VANCOCIN) 1,500 mg in 0.9% sodium chloride 250 mL IVPB  1,500 mg IntraVENous ONCE   ??? enoxaparin (LOVENOX) injection 40 mg  40 mg SubCUTAneous Q24H   ??? piperacillin-tazobactam (ZOSYN) 3.375 g in 0.9% sodium chloride (MBP/ADV) 100 mL MBP  3.375 g IntraVENous Q8H   ??? VANCOMYCIN INFORMATION NOTE  Other PRN   ??? oseltamivir (TAMIFLU) capsule 75 mg  75 mg Oral BID       Allergy:  No Known Allergies    Isolation:   There are currently no Active Isolations    Review of Systems  Constitution.: See HPI.  Eyes:   Negative for visual disturbance, irritation, discharge, redness and icterus.  ENT:  Negative for ear drainage, earaches, congestion and sore throat  Respiratory:  SEE HPI.  CVS:   Negative for CP, dyspnea, palpitations, or syncope. No Hx of Endocarditis.  GI:   Negative for dysphagia, N/V, diarrhea, abdominal pain and jaundice.  GUS:  Negative for urgency, frequency, dysuria, incontinence, and hematuria.  Integument:  Negative for rash, pruritus, IVDA. No breast discharge.  Hematologic: Negative for easy bruising, bleeding, and lymphadenopathy.  Musculoske.: SEE HPI.  Neurological: Negative for HA, vertigo or seizures. No weakness or incontinence.  Psychiatric:  Negative for anxiety, and bipolar or non compliance to therapy.  Endocrine:  Negative for polyuria, polydipsia, polyphagia, pruritus and weight loss.  Allergic:  Negative for urticaria, hay fever, angioedema and anaphylaxis.    Physical Examination:   Temp (24hrs), Avg:99.5 ??F (37.5 ??C), Min:98.8 ??F (37.1 ??C), Max:100.2 ??F (37.9 ??C)      Visit Vitals   ??? BP (!) 149/104 (BP 1 Location: Right arm, BP Patient Position: At rest)   ??? Pulse 87   ??? Temp 100.2 ??F (37.9 ??C)   ??? Resp 16   ??? Ht 6\' 3"  (1.905 m)   ??? Wt 72.6 kg (160 lb)   ??? SpO2 98%   ??? BMI 20 kg/m2       GENERAL:  In no acute distress, comfortable, lying in bed.  SKIN:   Warm, moist.  Normal skin turgor.  No rash or scratch mark noted.   HEENT:   Normocephalic, Atraumatic,     Pink conjunctiva, Anicteric, EOMI,     No sinus tenderness or stuffy nose and     Clear & moist oral mucosa.  NECK: Supple, no JVD, no thyroidmegammy or lymphadenopathy.  AXILLA: No Lymphadenopathy.  LUNGS:   Symmetrical, Resonant and CTA BL. No wheezes, rales,or rhonchi,  CARDIAC:  S1 & S2, regular rate and rhythmn. No murmurs, rubs or gallops heard  ABDOMEN:   Moves with respiration. Soft, NT, BS +, & no mass or organomegaly.  INGUINAL: No hernia or lymphadenopathy.   GUS:  Genitals grossly intact. No supra pubic area tenderness. No CVAT.  EXTREM.:   No edema, clubbing, or cyanosis. Distal pulses intact BL DP.     Full range of motion on extrimities.  NEURO:   AOX3, No focal deficits. Sensation and strength grossly intact.    No astexis, Clonus or Babinski noted.    Labs Reviewed:  Recent Labs      02/24/17   0600   WBC  8.1   HGB  16.2*   HCT  45.8*   PLT  107*     Recent Labs      02/24/17   0600   NA  138   K  4.4   CL  99   CO2  26   GLU  143*   BUN  14   CREA  1.23   CA  8.7   ALB  3.1*   SGOT  131*   ALT  113*     No components found for: GLPOC  No results for input(s): PH, PCO2, PO2, HCO3, FIO2 in the last 72  hours.  Recent Labs      02/24/17   0600   INR  1.6*       Culture  All Micro Results     Procedure Component Value Units Date/Time    CULTURE, BLOOD [[id number]] Collected:  02/24/17 0430    Order Status:  Completed Specimen:  Whole Blood from Blood Updated:  02/24/17 0840     CULTURE, URINE [[id number]] Collected:  02/24/17 0630    Order Status:  Completed Specimen:  Urine from Foley Specimen Updated:  02/24/17 0743          Urine  Color   Date Value Ref Range Status   02/24/2017 YELLOW   Final     Appearance   Date Value Ref Range Status   02/24/2017 CLEAR   Final     Specific gravity   Date Value Ref Range Status   02/24/2017 1.028   Final     pH (UA)   Date Value Ref Range Status   02/24/2017 5.5 5.0 - 7.0   Final     Protein   Date Value Ref Range Status   02/24/2017 100 (A) NEG mg/dL Final     Ketone   Date Value Ref Range Status   02/24/2017 TRACE (A) NEG mg/dL Final     Bilirubin   Date Value Ref Range Status   02/24/2017 NEGATIVE  NEG   Final     Blood   Date Value Ref Range Status   02/24/2017 MODERATE (A) NEG   Final     Urobilinogen   Date Value Ref Range Status   02/24/2017 1.0 0.2 - 1.0 EU/dL Final     Nitrites   Date Value Ref Range Status   02/24/2017 NEGATIVE  NEG   Final     Leukocyte Esterase   Date Value Ref Range Status   02/24/2017 NEGATIVE  NEG   Final       Radiology Studies:  Xr Chest Port    Result Date: 02/24/2017  EXAM:  XR CHEST PORT dated 02/24/2017 8:31 AM INDICATION:  pre op. Nurses note states patient coughing up blood-tinged sputum. COMPARISON:  None. FINDINGS: A portable AP semierect radiograph of the chest was obtained at 0819 hours. There are bilateral lower lung zone infiltrates slightly more prominent on the left than the right. There is no obscuration of the diaphragm or of the heart border, and localization of the infiltrates is difficult. Cannot rule out overlying pleural disease. The upper lung zones are clear. Heart size is normal. No hilar or mediastinal mass.     IMPRESSION:  Bilateral lower lung zone densities consistent with bilateral infiltrates. Consider conventional PA and lateral views for better characterization.       CODE STATUS:  Full code.    Halina Andreas, MD  02/24/2017  10:55 AM

## 2017-02-24 NOTE — Progress Notes (Signed)
Pharmacy Dosing Services:  Lovenox  Consult for Enoxaparin Dosing by Dr.  Dwyane DeeAtnafu  Enoxaparin Indication:  VTE Prophylaxis  Previous Dose  40 mg Sq qd   Serum Creatinine Lab Results   Component Value Date/Time    Creatinine 1.23 02/24/2017 06:00 AM      Creatinine Clearance Estimated Creatinine Clearance: 82.8 mL/min (based on Cr of 1.23).   Platelets Lab Results   Component Value Date/Time    PLATELET 107 (L) 02/24/2017 06:00 AM       H/H Lab Results   Component Value Date/Time    HGB 16.2 (H) 02/24/2017 06:00 AM        Adjustments:   None  Continue to monitor  Signed Mearl LatinIrene O Oluwole, PHARMD

## 2017-02-24 NOTE — Other (Signed)
Bedside and Verbal shift change report given to Janet RN (oncoming nurse) by Horace RN (offgoing nurse). Report included the following information SBAR, Kardex and MAR.

## 2017-02-24 NOTE — Progress Notes (Signed)
Problem: Pneumonia: Day 3  Goal: Respiratory  Treatments/intervention  Outcome: Progressing Towards Goal  Activity safety

## 2017-02-24 NOTE — Progress Notes (Signed)
Spiritual Care Assessment/Progress Notes    William JamesMelvin Greenbaum [id number]  [id number]    1976/11/21  41 y.o.  male    Patient Telephone Number: [phone] (home)   Religious Affiliation: No religion   Language: English   Extended Emergency Contact Information  Primary Emergency Contact: [facility]  Address: [facility]           [address]           [place], [place] [id number] [place]  Home Phone: [phone]  Relation: Caregiver   Patient Active Problem List    Diagnosis Date Noted   ??? Influenza 02/23/2017   ??? CAP (community acquired pneumonia) 02/23/2017        Date: 02/24/2017       Level of Religious/Spiritual Activity:  []          Involved in faith tradition/spiritual practice    []          Not involved in faith tradition/spiritual practice  []          Spiritually oriented    []          Claims no spiritual orientation    []          seeking spiritual identity  []          Feels alienated from religious practice/tradition  []          Feels angry about religious practice/tradition  [x]          Spirituality/religious tradition is a Theatre stage managerresource for coping at this time.  []          Not able to assess due to medical condition    Services Provided Today:  []          crisis intervention    []          reading Scriptures  []          spiritual assessment    [x]          prayer  [x]          empathic listening/emotional support  []          rites and rituals (cite in comments)  []          life review     []          religious support  []          theological development   []          advocacy  []          ethical dialog     []          blessing  []          bereavement support    []          support to family  []          anticipatory grief support   []          help with AMD  []          spiritual guidance    []          meditation      Spiritual Care Needs  [x]          Emotional Support  [x]          Spiritual/Religious Care  []          Loss/Adjustment  []          Advocacy/Referral                /Ethics   []   No needs expressed at               this time  []          Other: (note in               comments)  Spiritual Care Plan  []          Follow up visits with               pt/family  []          Provide materials  []          Schedule sacraments  []          Contact Community               Clergy  [x]          Follow up as needed  [x]          Other: (note in               comments)     Comments: Patient agreed to pastoral care visit . Very thankful for pastoral care's presence.  Kathi Simpers

## 2017-02-24 NOTE — Progress Notes (Signed)
Consult for Vancomycin Dosing by Pharmacy by Dr. Dwyane Deeatnafu  Indication:  Pneumonia(CAP)  Target Level:  15-20  Day of Therapy: 1  Previous Regimen  Vanco 1500 mg Iv x1 dose @0800  on 02/25/17   Last Level  None   Other Current Antibiotics  Zosyn 3.375 GM Iv q8 Hrs   Significant Cultures  None   Serum Creatinine Lab Results   Component Value Date/Time    Creatinine 1.23 02/24/2017 06:00 AM       Creatinine Clearance Estimated Creatinine Clearance: 82.8 mL/min (based on Cr of 1.23).   BUN Lab Results   Component Value Date/Time    BUN 14 02/24/2017 06:00 AM       WBC Lab Results   Component Value Date/Time    WBC 8.1 02/24/2017 06:00 AM       H/H Lab Results   Component Value Date/Time    HGB 16.2 (H) 02/24/2017 06:00 AM      Platelets Lab Results   Component Value Date/Time    PLATELET 107 (L) 02/24/2017 06:00 AM      Temp Temp: 100.2 ??F (37.9 ??C)     Dose administration notes:   Doses given appropriately as scheduled  Plan for level:  Not Now  Adjustments:  None  Plan:   Start vancomycin 1250 mg  Iv Q 12 Hrs @2100               Continue to monitor

## 2017-02-24 NOTE — Progress Notes (Signed)
Problem: Pneumonia: Day 4  Goal: Activity/Safety  Outcome: Progressing Towards Goal  Activity/Safety

## 2017-02-24 NOTE — Progress Notes (Signed)
Consult for Vancomycin Dosing by Pharmacy by Dr. Dwyane DeeAtnafu  Indication:  Pneumonia (CAP)  Target Level: 15-20  Day of Therapy: 1  Previous Regimen  Vanco 1500 mg IV x1 dose @0800  on 02/24/17   Last Level  None   Other Current Antibiotics  Zosyn 3.375 GM IV q 8 Hrs   Significant Cultures  pending   Serum Creatinine Lab Results   Component Value Date/Time    Creatinine 1.23 02/24/2017 06:00 AM       Creatinine Clearance Estimated Creatinine Clearance: 82.8 mL/min (based on Cr of 1.23).   BUN Lab Results   Component Value Date/Time    BUN 14 02/24/2017 06:00 AM       WBC Lab Results   Component Value Date/Time    WBC 8.1 02/24/2017 06:00 AM       H/H Lab Results   Component Value Date/Time    HGB 16.2 (H) 02/24/2017 06:00 AM      Platelets Lab Results   Component Value Date/Time    PLATELET 107 (L) 02/24/2017 06:00 AM      Temp Temp: 100.2 ??F (37.9 ??C)     Dose administration notes:   Doses given appropriately as scheduled  Plan for level:  Not Now  Adjustments:  None  Plan:   Vancomycin 1250 mg Iv Q12 Hr @ 2100              Continue to monitor

## 2017-02-24 NOTE — Progress Notes (Signed)
Patient admitted to Room 276 at 2100 hrs, February 23, 2017. Alert & oriented x3; ambulatory to bathroom without assistance. Patient coughing up blood-tinge sputum, and reports not having any appetite. X1 loose stool this shift. D5.9 Infusing at 100 ml/hr. Temp up to100.2; awaiting vancomycin and zosyn from pharmacy. Blood cx's and urine cx's sent to lab.

## 2017-02-25 ENCOUNTER — Inpatient Hospital Stay: Admit: 2017-02-25 | Payer: MEDICAID | Primary: Family Medicine

## 2017-02-25 LAB — METABOLIC PANEL, COMPREHENSIVE
A-G Ratio: 0.7 — ABNORMAL LOW (ref 1.0–3.1)
ALT (SGPT): 71 U/L (ref 12.0–78.0)
AST (SGOT): 59 U/L — ABNORMAL HIGH (ref 15–37)
Albumin: 2.1 g/dL — ABNORMAL LOW (ref 3.40–5.00)
Alk. phosphatase: 80 U/L (ref 46–116)
Anion gap: 16 mmol/L (ref 10–17)
BUN/Creatinine ratio: 13 (ref 6.0–20.0)
BUN: 14 MG/DL (ref 7–18)
Bilirubin, total: 2.9 MG/DL — ABNORMAL HIGH (ref 0.20–1.00)
CO2: 26 mmol/L (ref 21–32)
Calcium: 7.7 MG/DL — ABNORMAL LOW (ref 8.5–10.1)
Chloride: 97 mmol/L — ABNORMAL LOW (ref 98–107)
Creatinine: 1.06 MG/DL (ref 0.6–1.3)
GFR est AA: >60 mL/min/{1.73_m2} (ref >60)
GFR est non-AA: >60 mL/min/{1.73_m2} (ref >60)
Globulin: 3.2 g/dL
Glucose: 131 mg/dL — ABNORMAL HIGH (ref 74–106)
Potassium: 3.6 mmol/L (ref 3.50–5.10)
Protein, total: 5.3 g/dL — ABNORMAL LOW (ref 6.40–8.20)
Sodium: 135 mmol/L — ABNORMAL LOW (ref 136–145)

## 2017-02-25 LAB — BILIRUBIN,INDIRECT: Bilirubin, indirect: 1.6 MG/DL — ABNORMAL HIGH (ref 0.0–0.4)

## 2017-02-25 LAB — CBC WITH AUTOMATED DIFF
ABS. BASOPHILS: 0 10*3/uL (ref 0.0–0.2)
ABS. EOSINOPHILS: 0 10*3/uL (ref 0.0–0.7)
ABS. LYMPHOCYTES: 0.6 10*3/uL — ABNORMAL LOW (ref 1.2–3.4)
ABS. MONOCYTES: 0.7 10*3/uL — ABNORMAL LOW (ref 1.1–3.2)
ABS. NEUTROPHILS: 6.5 10*3/uL (ref 1.4–6.5)
BASOPHILS: 0 % (ref 0–2)
EOSINOPHILS: 0 % (ref 0–5)
HCT: 39.8 % (ref 36.8–45.2)
HGB: 14.1 g/dL (ref 12.8–15.0)
IMMATURE GRANULOCYTES: 1 % (ref 0.0–5.0)
LYMPHOCYTES: 8 % — ABNORMAL LOW (ref 16–40)
MCH: 28.8 PG (ref 27–31)
MCHC: 35.4 g/dL (ref 32–36)
MCV: 81.2 FL (ref 81–99)
MONOCYTES: 9 % (ref 0–12)
MPV: 11.7 FL — ABNORMAL HIGH (ref 7.4–10.4)
NEUTROPHILS: 83 % — ABNORMAL HIGH (ref 40–70)
PLATELET: 110 10*3/uL — ABNORMAL LOW (ref 140–450)
RBC: 4.9 M/uL (ref 4.0–5.2)
RDW: 13 % (ref 11.5–14.5)
WBC: 7.9 10*3/uL (ref 4.8–10.8)

## 2017-02-25 LAB — PTT: aPTT: 35.7 s — ABNORMAL HIGH (ref 24.5–31.6)

## 2017-02-25 LAB — HEPATITIS PANEL, ACUTE
Hep B Surface Ag: NONREACTIVE
Hepatitis A, IgM: NONREACTIVE
Hepatitis B core, IgM: NONREACTIVE
Hepatitis C virus Ab: NONREACTIVE

## 2017-02-25 LAB — PROTHROMBIN TIME + INR
INR: 1.2 (ref 0.9–1.2)
Prothrombin time: 13 s — ABNORMAL HIGH (ref 9.8–12.0)

## 2017-02-25 LAB — MAGNESIUM: Magnesium: 2.2 mg/dL (ref 1.8–2.4)

## 2017-02-25 LAB — BILIRUBIN, DIRECT: Bilirubin, direct: 1.3 MG/DL — ABNORMAL HIGH (ref 0.0–0.20)

## 2017-02-25 MED ORDER — OXYCODONE-ACETAMINOPHEN 5 MG-325 MG TAB
5-325 mg | ORAL | Status: DC | PRN
Start: 2017-02-25 — End: 2017-03-12
  Administered 2017-02-25 – 2017-03-12 (×32): via ORAL

## 2017-02-25 MED ORDER — PHARMACY VANCOMYCIN NOTE
Freq: Once | Status: AC
Start: 2017-02-25 — End: 2017-02-25
  Administered 2017-02-26: 01:00:00

## 2017-02-25 MED ORDER — IOPAMIDOL 61 % IV SOLN
300 mg iodine /mL (61 %) | Freq: Once | INTRAVENOUS | Status: AC
Start: 2017-02-25 — End: 2017-02-25
  Administered 2017-02-25: 17:00:00 via INTRAVENOUS

## 2017-02-25 MED ORDER — SODIUM CHLORIDE 0.9 % IJ SYRG
Freq: Once | INTRAMUSCULAR | Status: AC
Start: 2017-02-25 — End: 2017-02-25
  Administered 2017-02-25: 17:00:00 via INTRAVENOUS

## 2017-02-25 MED FILL — ISOVUE-300  61 % INTRAVENOUS SOLUTION: 300 mg iodine /mL (61 %) | INTRAVENOUS | Qty: 100

## 2017-02-25 MED FILL — DEXTROSE 5% IN NORMAL SALINE IV: INTRAVENOUS | Qty: 1000

## 2017-02-25 MED FILL — OXYCODONE-ACETAMINOPHEN 5 MG-325 MG TAB: 5-325 mg | ORAL | Qty: 2

## 2017-02-25 MED FILL — BD POSIFLUSH NORMAL SALINE 0.9 % INJECTION SYRINGE: INTRAMUSCULAR | Qty: 10

## 2017-02-25 MED FILL — ENOXAPARIN 40 MG/0.4 ML SUB-Q SYRINGE: 40 mg/0.4 mL | SUBCUTANEOUS | Qty: 0.4

## 2017-02-25 MED FILL — PHARMACY VANCOMYCIN NOTE: Qty: 1

## 2017-02-25 MED FILL — PIPERACILLIN-TAZOBACTAM 3.375 GRAM IV SOLR: 3.375 gram | INTRAVENOUS | Qty: 3.38

## 2017-02-25 MED FILL — VANCOMYCIN 1,000 MG IV SOLR: 1000 mg | INTRAVENOUS | Qty: 1250

## 2017-02-25 MED FILL — SENNA LAX 8.6 MG TABLET: 8.6 mg | ORAL | Qty: 1

## 2017-02-25 MED FILL — TAMIFLU 75 MG CAPSULE: 75 mg | ORAL | Qty: 1

## 2017-02-25 MED FILL — ZOSYN 3.375 GRAM INTRAVENOUS SOLUTION: 3.375 gram | INTRAVENOUS | Qty: 3.38

## 2017-02-25 MED FILL — TYLENOL 325 MG TABLET: 325 mg | ORAL | Qty: 2

## 2017-02-25 NOTE — Progress Notes (Signed)
BIL BS NOTED TO HAVE RHONCHI WITH COARSE NESS. PRODUCTIVE COUGH WITH BLOOD TINGED SPUTUM. REMAIN ON IVF AND IV ANTIBIOTICS. TEMP ELEVATED TYLENOL GIVEN. TEMP NOW 100.1

## 2017-02-25 NOTE — Progress Notes (Deleted)
Patient returned to unit post -Op TURP in stable condition.

## 2017-02-25 NOTE — Other (Signed)
Bedside and Verbal shift change report given to Nichelle RN (oncoming nurse) by Horace RN (offgoing nurse). Report included the following information SBAR, Kardex and MAR.

## 2017-02-25 NOTE — Other (Signed)
Bedside and Verbal shift change report given to  [facility] (oncoming nurse) by  Wallace CullensJANET BRILL RN  (offgoing nurse). Report included the following information SBAR and Kardex.

## 2017-02-25 NOTE — Other (Signed)
Pt was tested for HIV.  Test result is negative.

## 2017-02-25 NOTE — Progress Notes (Signed)
Consult for Vancomycin Dosing by Pharmacy by Dr. Dwyane DeeAtnafu  Indication:CAP  Target Level:15-20  Day of Therapy :2  Previous Regimen 1250 mg q12h   Last Level None   Other Current Antibiotics Zosyn 3.375 gm q8h   Significant Cultures None   Serum Creatinine Lab Results   Component Value Date/Time    Creatinine 1.23 02/24/2017 06:00 AM       Creatinine Clearance Estimated Creatinine Clearance: 82.8 mL/min (based on Cr of 1.23).   BUN Lab Results   Component Value Date/Time    BUN 14 02/24/2017 06:00 AM       WBC Lab Results   Component Value Date/Time    WBC 8.1 02/24/2017 06:00 AM       H/H Lab Results   Component Value Date/Time    HGB 16.2 (H) 02/24/2017 06:00 AM      Platelets Lab Results   Component Value Date/Time    PLATELET 107 (L) 02/24/2017 06:00 AM      Temp Temp: 98.5 ??F (36.9 ??C)       Dose administration notes:   Doses given appropriately as scheduled    Plan for level: Trough scheduled for 8:30 pm tonight for dosing adjustment  Adjustments:  Continue current dose  Plan:  Continue to monitor

## 2017-02-25 NOTE — Progress Notes (Signed)
Patient Active Problem List   Diagnosis Code   ??? Influenza J11.1   ??? CAP (community acquired pneumonia) J18.9       Subjective: Still complains of generalized ache and Left sided lateral chest wall pain on coughing    Still coughing with bloody sputum    Fever getting better    Looks comfortable though    Physical Examination:  Temp (24hrs), Avg:100.5 ??F (38.1 ??C), Min:98.5 ??F (36.9 ??C), Max:102.9 ??F (39.4 ??C)      Visit Vitals   ??? BP 115/76 (BP 1 Location: Left arm, BP Patient Position: Head of bed elevated (Comment degrees);Supine)   ??? Pulse 91   ??? Temp 98.5 ??F (36.9 ??C)   ??? Resp 20   ??? Ht 6\' 3"  (1.905 m)   ??? Wt 72.6 kg (160 lb)   ??? SpO2 92%   ??? BMI 20 kg/m2       GENERAL:  In no acute distress, comfortable, lying in bed.  SKIN:   Warm, moist.  Normal skin turgor.  No rash or scratch mark noted.   HEENT:   Normocephalic, Atraumatic,     Pink conjunctiva, Anicteric, EOMI,     No sinus tenderness or stuffy nose and     Clear & moist oral mucosa.  NECK: Supple, no JVD, no thyroidmegammy or lymphadenopathy.  AXILLA: No Lymphadenopathy.  LUNGS:   L>R more rhonchi  CARDIAC:  S1 & S2, RRR. No murmurs, rubs or gallops heard  ABDOMEN:   Moves with respiration. Soft, NT, BS +.   INGUINAL: No hernia or lymphadenopathy.   GUS:  No supra pubic area tenderness. No CVAT.  EXTREM.:   No edema.   NEURO:   AOX3, No focal deficits..        Labs Reviewed:  Recent Labs      02/25/17   1100  02/24/17   0600   WBC  7.9  8.1   HGB  14.1  16.2*   HCT  39.8  45.8*   PLT  110*  107*     Recent Labs      02/25/17   1100  02/24/17   0600   NA  135*  138   K  3.6  4.4   CL  97*  99   CO2  26  26   GLU  131*  143*   BUN  14  14   CREA  1.06  1.23   CA  7.7*  8.7   MG  2.2   --    ALB  2.1*  3.1*   SGOT  59*  131*   ALT  71  113*     No components found for: GLPOC  No results for input(s): PH, PCO2, PO2, HCO3, FIO2 in the last 72 hours.  Recent Labs      02/25/17   1100  02/24/17   0600   INR  1.2  1.6*       Culture  All Micro Results      Procedure Component Value Units Date/Time    CULTURE, RESPIRATORY/SPUTUM/BRONCH Berniece AndreasW GRAM STAIN [[id number]][[id number]]     Order Status:  Sent Specimen:  Sputum from Sputum     CULTURE, BLOOD [[id number]] Collected:  02/24/17 0430    Order Status:  Completed Specimen:  Whole Blood from Blood Updated:  02/25/17 1338     Special Requests: NO SPECIAL REQUESTS        Culture result: NO GROWTH 1 DAY  CULTURE, URINE [[id number]] Collected:  02/24/17 0630    Order Status:  Completed Specimen:  Urine from Foley Specimen Updated:  02/25/17 1320     Special Requests: NO SPECIAL REQUESTS        Culture result: NO GROWTH 24 HOURS       CULTURE, BLOOD [[id number]] Collected:  02/25/17 0900    Order Status:  Completed Specimen:  Whole Blood from Blood Updated:  02/25/17 1237          Urine  Color   Date Value Ref Range Status   02/24/2017 YELLOW   Final     Appearance   Date Value Ref Range Status   02/24/2017 CLEAR   Final     Specific gravity   Date Value Ref Range Status   02/24/2017 1.028   Final     pH (UA)   Date Value Ref Range Status   02/24/2017 5.5 5.0 - 7.0   Final     Protein   Date Value Ref Range Status   02/24/2017 100 (A) NEG mg/dL Final     Ketone   Date Value Ref Range Status   02/24/2017 TRACE (A) NEG mg/dL Final     Bilirubin   Date Value Ref Range Status   02/24/2017 NEGATIVE  NEG   Final     Blood   Date Value Ref Range Status   02/24/2017 MODERATE (A) NEG   Final     Urobilinogen   Date Value Ref Range Status   02/24/2017 1.0 0.2 - 1.0 EU/dL Final     Nitrites   Date Value Ref Range Status   02/24/2017 NEGATIVE  NEG   Final     Leukocyte Esterase   Date Value Ref Range Status   02/24/2017 NEGATIVE  NEG   Final       Radiology Studies:  Xr Spine Lumb Min 4 V    Result Date: 02/25/2017  EXAM:  XR SPINE LUMB MIN 4 V dated 02/25/2017 12:28 PM INDICATION:   LBP COMPARISON:  None. FINDINGS:  AP, lateral, and oblique views of the lumbar spine were obtained. No fracture or subluxation. No disc space narrowing.  No arthritic changes. No osteolytic or osteoblastic lesion. SI joints are unremarkable. There is opacification of both renal collecting system and ureters from the intravenous contrast injection of the prior CT.     IMPRESSION:   Normal exam.     Ct Chest W Cont    Result Date: 02/25/2017  CT CHEST W CONT dated 02/25/2017 12:19 PM INDICATION:  Pneumonia, Hematemesis COMPARISON: None TECHNIQUE: Spiral CT with IV contrast bolus through the thorax was performed. 2.5 mm axial images with sagittal and coronal reconstructions produced. Contrast material 100 mL Isovue 300. One or more of the following dose reduction techniques were used: automated exposure control, adjustment of the mA and/or kV according to patient size, use of iterative reconstruction technique. INTERPRETATION:  There is less than optimal opacification of the pulmonary arterial tree. No definite pulmonary emboli can be seen. The aorta is unremarkable. There are dense consolidative infiltrates in both lower lobes, and there is patchy infiltration in the left upper lobe. No definite pleural effusion is seen. Multiple air bronchograms are seen in the lower lobes. No hilar or mediastinal mass is identified. The tracheobronchial tree is unremarkable. The thyroid is unremarkable. No cervical lymphadenopathy is seen. No chest wall abnormality is observed. Limited images the upper abdomen show no significant abnormality. The visualized skeletal structures are unremarkable.  IMPRESSION: 1.  Dense bilateral lower lobe infiltrates consistent with pneumonia. There is also patchy infiltration in the left upper lobe. 2.  Suboptimal opacification of the pulmonary arterial tree. No gross pulmonary embolic disease is observed.    Xr Chest Port    Result Date: 02/24/2017  EXAM:  XR CHEST PORT dated 02/24/2017 8:31 AM INDICATION:  pre op. Nurses note states patient coughing up blood-tinged sputum. COMPARISON:  None.  FINDINGS: A portable AP semierect radiograph of the chest was obtained at 0819 hours. There are bilateral lower lung zone infiltrates slightly more prominent on the left than the right. There is no obscuration of the diaphragm or of the heart border, and localization of the infiltrates is difficult. Cannot rule out overlying pleural disease. The upper lung zones are clear. Heart size is normal. No hilar or mediastinal mass.     IMPRESSION:  Bilateral lower lung zone densities consistent with bilateral infiltrates. Consider conventional PA and lateral views for better characterization.         Assessment: Improving    Today's lab was noted.    Plan:  Continue current meds    Will get CT of the chest    Sputum culture    Monitor culture, fluid and electrolyte.   `

## 2017-02-25 NOTE — Progress Notes (Signed)
Problem: Risk for Spread of Infection  Goal: Prevent transmission of infectious organism to others  Prevent the transmission of infectious organisms to other patients, staff members, and visitors.   Outcome: Progressing Towards Goal  Prevention of transmission of infectious organisms/Hand hygiene

## 2017-02-25 NOTE — Progress Notes (Signed)
Patient noted with coarse breath sounds,productive cough with thick blood tinged secretions.

## 2017-02-26 ENCOUNTER — Inpatient Hospital Stay: Admit: 2017-02-26 | Payer: MEDICAID | Primary: Family Medicine

## 2017-02-26 LAB — VANCOMYCIN, TROUGH: Vancomycin,trough: 8.4 ug/mL — ABNORMAL LOW (ref 15–20)

## 2017-02-26 LAB — CBC WITH AUTOMATED DIFF
ABS. BASOPHILS: 0.1 10*3/uL (ref 0.0–0.2)
ABS. EOSINOPHILS: 0 10*3/uL (ref 0.0–0.7)
ABS. LYMPHOCYTES: 1.6 10*3/uL (ref 1.2–3.4)
ABS. MONOCYTES: 0.8 10*3/uL — ABNORMAL LOW (ref 1.1–3.2)
ABS. NEUTROPHILS: 4.8 10*3/uL (ref 1.4–6.5)
BASOPHILS: 1 % (ref 0–2)
EOSINOPHILS: 0 % (ref 0–5)
HCT: 34.3 % — ABNORMAL LOW (ref 36.8–45.2)
HGB: 12.2 g/dL — ABNORMAL LOW (ref 12.8–15.0)
IMMATURE GRANULOCYTES: 1 % (ref 0.0–5.0)
LYMPHOCYTES: 22 % (ref 16–40)
MCH: 28.8 PG (ref 27–31)
MCHC: 35.6 g/dL (ref 32–36)
MCV: 81.1 FL (ref 81–99)
MONOCYTES: 11 % (ref 0–12)
MPV: 11.1 FL — ABNORMAL HIGH (ref 7.4–10.4)
NEUTROPHILS: 66 % (ref 40–70)
PLATELET: 122 10*3/uL — ABNORMAL LOW (ref 140–450)
RBC: 4.23 M/uL (ref 4.0–5.2)
RDW: 13.1 % (ref 11.5–14.5)
WBC: 7.3 10*3/uL (ref 4.8–10.8)

## 2017-02-26 LAB — METABOLIC PANEL, COMPREHENSIVE
A-G Ratio: 0.6 — ABNORMAL LOW (ref 1.0–3.1)
ALT (SGPT): 71 U/L (ref 12.0–78.0)
AST (SGOT): 88 U/L — ABNORMAL HIGH (ref 15–37)
Albumin: 1.8 g/dL — ABNORMAL LOW (ref 3.40–5.00)
Alk. phosphatase: 95 U/L (ref 46–116)
Anion gap: 13 mmol/L (ref 10–17)
BUN/Creatinine ratio: 11 (ref 6.0–20.0)
BUN: 14 MG/DL (ref 7–18)
Bilirubin, total: 2.8 MG/DL — ABNORMAL HIGH (ref 0.20–1.00)
CO2: 27 mmol/L (ref 21–32)
Calcium: 7.4 MG/DL — ABNORMAL LOW (ref 8.5–10.1)
Chloride: 95 mmol/L — ABNORMAL LOW (ref 98–107)
Creatinine: 1.25 MG/DL (ref 0.6–1.3)
GFR est AA: >60 mL/min/{1.73_m2} (ref >60)
GFR est non-AA: >60 mL/min/{1.73_m2} (ref >60)
Globulin: 3.2 g/dL
Glucose: 113 mg/dL — ABNORMAL HIGH (ref 74–106)
Potassium: 3.5 mmol/L (ref 3.50–5.10)
Protein, total: 5 g/dL — ABNORMAL LOW (ref 6.40–8.20)
Sodium: 131 mmol/L — ABNORMAL LOW (ref 136–145)

## 2017-02-26 LAB — CULTURE, URINE
Culture result:: NO GROWTH
Culture: NO GROWTH

## 2017-02-26 LAB — BILIRUBIN, DIRECT: Bilirubin, direct: 1.3 MG/DL — ABNORMAL HIGH (ref 0.0–0.20)

## 2017-02-26 LAB — MAGNESIUM: Magnesium: 2.1 mg/dL (ref 1.8–2.4)

## 2017-02-26 LAB — BILIRUBIN,INDIRECT: Bilirubin, indirect: 1.5 MG/DL — ABNORMAL HIGH (ref 0.0–0.4)

## 2017-02-26 MED ORDER — PHARMACY VANCOMYCIN NOTE
Freq: Once | Status: DC
Start: 2017-02-26 — End: 2017-02-27

## 2017-02-26 MED ORDER — SODIUM CHLORIDE 0.9 % IV
3.375 gram | Freq: Three times a day (TID) | INTRAVENOUS | Status: DC
Start: 2017-02-26 — End: 2017-02-28
  Administered 2017-02-27 – 2017-02-28 (×5): via INTRAVENOUS

## 2017-02-26 MED ORDER — VANCOMYCIN 10 GRAM IV SOLR
10 gram | Freq: Three times a day (TID) | INTRAVENOUS | Status: DC
Start: 2017-02-26 — End: 2017-02-28
  Administered 2017-02-26 – 2017-02-28 (×6): via INTRAVENOUS

## 2017-02-26 MED FILL — TAMIFLU 75 MG CAPSULE: 75 mg | ORAL | Qty: 1

## 2017-02-26 MED FILL — VANCOMYCIN 1,000 MG IV SOLR: 1000 mg | INTRAVENOUS | Qty: 1000

## 2017-02-26 MED FILL — OXYCODONE-ACETAMINOPHEN 5 MG-325 MG TAB: 5-325 mg | ORAL | Qty: 2

## 2017-02-26 MED FILL — PIPERACILLIN-TAZOBACTAM 3.375 GRAM IV SOLR: 3.375 gram | INTRAVENOUS | Qty: 3.38

## 2017-02-26 MED FILL — VANCOMYCIN 10 GRAM IV SOLR: 10 gram | INTRAVENOUS | Qty: 1250

## 2017-02-26 MED FILL — SENNA LAX 8.6 MG TABLET: 8.6 mg | ORAL | Qty: 1

## 2017-02-26 MED FILL — ZOSYN 3.375 GRAM INTRAVENOUS SOLUTION: 3.375 gram | INTRAVENOUS | Qty: 3.38

## 2017-02-26 MED FILL — PHARMACY VANCOMYCIN NOTE: Qty: 1

## 2017-02-26 MED FILL — ENOXAPARIN 40 MG/0.4 ML SUB-Q SYRINGE: 40 mg/0.4 mL | SUBCUTANEOUS | Qty: 0.4

## 2017-02-26 NOTE — Progress Notes (Signed)
Will place patient on NC at this time for comfort. Will monitor.

## 2017-02-26 NOTE — Progress Notes (Signed)
Bedside, Verbal and Written shift change report given to anisse (oncoming nurse) by todd (offgoing nurse). Report included the following information SBAR, Kardex, MAR and Recent Results.

## 2017-02-26 NOTE — Progress Notes (Signed)
Received report from [facility].archampong RN. Pt AAOX3. No c/o pain @ time of assessment. Droplet precaution maintained. He is currently resting comfortably. Will continue to monitor. Sputum cx and Vanc T. Sent to lab.

## 2017-02-26 NOTE — Progress Notes (Signed)
Resting at this time in no apparent respiratory distress. NC at 3 lpm in use. Patient with productive cough. Thick bloody secretions coughed up. Will monitor. No fever at this time.

## 2017-02-26 NOTE — Progress Notes (Signed)
Dr.[name] made aware of Pt T.101.0 then progressing to 101.2 after given Percocet. No no orders given. He will evaluate when he comes to do his rounds.

## 2017-02-26 NOTE — Progress Notes (Signed)
Up in chair at this time with ivABT and NC in use. No reported SOB. Pain at 4/10. Cough has subsided at this time.

## 2017-02-26 NOTE — Progress Notes (Signed)
Patient is awake /alert and oriented x 4 with no report of SOB or pain at this time. Patient has a productive cough with blood tinged sputum in moderate amounts. Febrile 102.9. Dr. Dwyane DeeAtnafu is aware [facility] fever [facility]. Given Percocet x 2 fo r pain and fever at 0440. IVF and IVABT as ordered. Will monitor.

## 2017-02-26 NOTE — Progress Notes (Signed)
Assumed care of patient at 1900. Patient in no acute distress at this time. Physical assessment as completed. Patient medicated for pain and given night meds as ordered. Patient continues with productive cough. Droplet precautions maintained. Patient is afebrile at this time. Will continue plan of care.

## 2017-02-26 NOTE — Progress Notes (Signed)
No xray reading at this time.

## 2017-02-26 NOTE — Progress Notes (Signed)
Pharmacy Dosing Services: Vancomycin    Consult for Vancomycin Dosing by Pharmacy by Dr. Dwyane DeeAtnafu  Indication: Post Viral CAP(MRSA suspected)  Target level: 15-20  Day of Therapy 3    Previous Regimen Vanc 1250mg  iv q12hrs   Last Level Trough level of 8.4 on 02/25/17   Other Current Antibiotics Zosyn 3.375gm iv q8hrs   Significant Cultures Sputum culture on 02/24/17:gram +ve cocci    Serum Creatinine Lab Results   Component Value Date/Time    Creatinine 1.25 02/26/2017 09:00 AM      Creatinine Clearance Estimated Creatinine Clearance: 81.5 mL/min (based on Cr of 1.25).   BUN Lab Results   Component Value Date/Time    BUN 14 02/26/2017 09:00 AM       WBC Lab Results   Component Value Date/Time    WBC 7.3 02/26/2017 09:00 AM      H/H    Lab Results   Component Value Date/Time    HGB 12.2 (L) 02/26/2017 09:00 AM      Platelets Lab Results   Component Value Date/Time    PLATELET 122 (L) 02/26/2017 09:00 AM      Temp 99.4 ??F (37.4 ??C)     Dose administration notes:   Doses given appropriately as scheduled    Plan for level:level prior to 5pm dose on 02/27/17  Adjustments:  dose increased  Plan: Last level subtherapeutic at 8.4. Doses were given appropriately.Dose changed to Vanc 1gm iv q8hrs.Level ordered for tomorrow.Will Continue to monitor

## 2017-02-26 NOTE — Progress Notes (Signed)
No reading. Xray tech reports he will have the radiologist look at xray. Dr. Jeanene Erballed. Reports that Dr. Dwyane DeeAtnafu should give him a call ASAP. Dr. Dwyane DeeAtnafu called. No answer at this time.

## 2017-02-26 NOTE — Progress Notes (Signed)
Back to floor from xray

## 2017-02-26 NOTE — Progress Notes (Signed)
Bathing. No sob. For xray. Officers here.

## 2017-02-26 NOTE — Progress Notes (Signed)
Patient up in chair at this time.Xray completed. Nc at 3lpm for comfort. Will monitor.

## 2017-02-26 NOTE — Progress Notes (Signed)
Patient Active Problem List   Diagnosis Code   ??? Influenza J11.1   ??? CAP (community acquired pneumonia) J18.9       Subjective: Continue to C/O of worsening of LLL pain    Continue to have fever    Continue to have worsening of hemoptysis    HGB dropped from 16 to 12 (Partially hemodilution!)    Physical Examination:  Temp (24hrs), Avg:100.7 ??F (38.2 ??C), Min:98.6 ??F (37 ??C), Max:102.9 ??F (39.4 ??C)      Visit Vitals   ??? BP 128/80 (BP 1 Location: Left arm, BP Patient Position: At rest)   ??? Pulse (!) 101   ??? Temp (!) 102.3 ??F (39.1 ??C)   ??? Resp 20   ??? Ht 6\' 3"  (1.905 m)   ??? Wt 72.6 kg (160 lb)   ??? SpO2 94%   ??? BMI 20 kg/m2       GENERAL:  In no acute distress, comfortable, lying in bed.  SKIN:   Warm, moist.  Normal skin turgor.  No rash or scratch mark noted.   HEENT:   Normocephalic, Atraumatic,     Pink conjunctiva, Anicteric, EOMI,     No sinus tenderness or stuffy nose and     Clear & moist oral mucosa.  NECK: Supple, no JVD, no thyroidmegammy or lymphadenopathy.  AXILLA: No Lymphadenopathy.  LUNGS:   Symmetrical, Resonant and BL basal crackles and decrease breath sound.  CARDIAC:  S1 & S2, RRR. No murmurs, rubs or gallops heard  ABDOMEN:   Moves with respiration. Soft, NT, BS +.   INGUINAL: No hernia or lymphadenopathy.   GUS:  No supra pubic area tenderness. No CVAT.  EXTREM.:   No edema.   NEURO:   AOX3, No focal deficits..        Labs Reviewed:  Recent Labs      02/26/17   0900  02/25/17   1100  02/24/17   0600   WBC  7.3  7.9  8.1   HGB  12.2*  14.1  16.2*   HCT  34.3*  39.8  45.8*   PLT  122*  110*  107*     Recent Labs      02/26/17   0900  02/25/17   1100  02/24/17   0600   NA  131*  135*  138   K  3.5  3.6  4.4   CL  95*  97*  99   CO2  27  26  26    GLU  113*  131*  143*   BUN  14  14  14    CREA  1.25  1.06  1.23   CA  7.4*  7.7*  8.7   MG  2.1  2.2   --    ALB  1.8*  2.1*  3.1*   SGOT  88*  59*  131*   ALT  71  71  113*     No components found for: GLPOC   No results for input(s): PH, PCO2, PO2, HCO3, FIO2 in the last 72 hours.  Recent Labs      02/25/17   1100  02/24/17   0600   INR  1.2  1.6*       Culture  All Micro Results     Procedure Component Value Units Date/Time    CULTURE, BLOOD [[id number]] Collected:  02/24/17 0430    Order Status:  Completed Specimen:  Whole Blood from Blood Updated:  02/26/17 1440     Special Requests: NO SPECIAL REQUESTS        Culture result: NO GROWTH 2 DAYS       CULTURE, RESPIRATORY/SPUTUM/BRONCH Gay FillerW GRAM STAIN [[id number]]  (Abnormal) Collected:  02/25/17 1930    Order Status:  Completed Specimen:  Sputum from Sputum Updated:  02/26/17 1337     Special Requests: NO SPECIAL REQUESTS        GRAM STAIN         MANY POLYMORPHONUCLEAR LEUKOCYTES (PMNs)              MODERATE GRAM POS COCCI IN CLUSTERS     Culture result:         HEAVY STAPHYLOCOCCUS AUREUS SENSITIVITY TO FOLLOW (A)              FEW ALPHA STREPTOCOCCUS SCREENING IN PROGRESS (A)    CULTURE, BLOOD [[id number]]  (Abnormal) Collected:  02/25/17 0900    Order Status:  Completed Specimen:  Whole Blood from Blood Updated:  02/26/17 1257     Special Requests: NO SPECIAL REQUESTS        GRAM STAIN         GRAM POS COCCI IN CLUSTERS AEROBIC BOTTLE CALLED RESULTS TO/VERIFIED READ BACK WITH NURSE TODD GREGORY AT 1255 ON 02/26/17 EXT.3426     Culture result:         Determined to be Staph Aureus by PNA FISH. AEROBIC BOTTLE CULTURE IN PROGRESS,FURTHER UPDATES TO FOLLOW (A)    CULTURE, URINE [[id number]] Collected:  02/24/17 0630    Order Status:  Completed Specimen:  Urine from Foley Specimen Updated:  02/26/17 1237     Special Requests: NO SPECIAL REQUESTS        Culture result: NO GROWTH 48 HOURS             Urine  Color   Date Value Ref Range Status   02/24/2017 YELLOW   Final     Appearance   Date Value Ref Range Status   02/24/2017 CLEAR   Final     Specific gravity   Date Value Ref Range Status   02/24/2017 1.028   Final     pH (UA)   Date Value Ref Range Status    02/24/2017 5.5 5.0 - 7.0   Final     Protein   Date Value Ref Range Status   02/24/2017 100 (A) NEG mg/dL Final     Ketone   Date Value Ref Range Status   02/24/2017 TRACE (A) NEG mg/dL Final     Bilirubin   Date Value Ref Range Status   02/24/2017 NEGATIVE  NEG   Final     Blood   Date Value Ref Range Status   02/24/2017 MODERATE (A) NEG   Final     Urobilinogen   Date Value Ref Range Status   02/24/2017 1.0 0.2 - 1.0 EU/dL Final     Nitrites   Date Value Ref Range Status   02/24/2017 NEGATIVE  NEG   Final     Leukocyte Esterase   Date Value Ref Range Status   02/24/2017 NEGATIVE  NEG   Final       Radiology Studies:  Xr Chest Pa Lat    Result Date: 02/26/2017  EXAM:  XR CHEST PA LAT dated 02/26/2017 10:31 AM INDICATION:   Persistent fever suspect empyema or abscess. COMPARISON: Chest AP semierect film from 02/24/2017.Chest CT scan 02/25/17. PA and lateral radiographs of the chest.  Dense  infiltrate or consolidation in the posterior right midlung adjacent to the fissure in the superior segment right lower lobe measuring approximately 8.6 x 7.3 x 7.3 cm has  increased since previous examination 02/24/17.  Infiltrate left lower lobe has increased.  Moderate consolidation left lower lobe again noted Lungs are normally inflated.  Normal heart size..  Right hilar prominence compatible with small nodes unchanged. Marland Kitchen     IMPRESSION: Increasing consolidation posterior right midlung and increasing infiltrate left lower lobe since previous examination 02/24/17.  Findings are unchanged since CT scan of 02/25/17. Dense consolidation left lower lobe measuring 4.9 cm in diameter, focal consolidation vs. Beginning of an abscess. Close follow-up examination suggested.  Some distention of colon with air unchanged.     Xr Spine Lumb Min 4 V    Result Date: 02/25/2017  EXAM:  XR SPINE LUMB MIN 4 V dated 02/25/2017 12:28 PM INDICATION:   LBP COMPARISON:  None. FINDINGS:  AP, lateral, and oblique views of the lumbar  spine were obtained. No fracture or subluxation. No disc space narrowing. No arthritic changes. No osteolytic or osteoblastic lesion. SI joints are unremarkable. There is opacification of both renal collecting system and ureters from the intravenous contrast injection of the prior CT.     IMPRESSION:   Normal exam.     Ct Chest W Cont    Result Date: 02/25/2017  CT CHEST W CONT dated 02/25/2017 12:19 PM INDICATION:  Pneumonia, Hematemesis COMPARISON: None TECHNIQUE: Spiral CT with IV contrast bolus through the thorax was performed. 2.5 mm axial images with sagittal and coronal reconstructions produced. Contrast material 100 mL Isovue 300. One or more of the following dose reduction techniques were used: automated exposure control, adjustment of the mA and/or kV according to patient size, use of iterative reconstruction technique. INTERPRETATION:  There is less than optimal opacification of the pulmonary arterial tree. No definite pulmonary emboli can be seen. The aorta is unremarkable. There are dense consolidative infiltrates in both lower lobes, and there is patchy infiltration in the left upper lobe. No definite pleural effusion is seen. Multiple air bronchograms are seen in the lower lobes. No hilar or mediastinal mass is identified. The tracheobronchial tree is unremarkable. The thyroid is unremarkable. No cervical lymphadenopathy is seen. No chest wall abnormality is observed. Limited images the upper abdomen show no significant abnormality. The visualized skeletal structures are unremarkable.     IMPRESSION: 1.  Dense bilateral lower lobe infiltrates consistent with pneumonia. There is also patchy infiltration in the left upper lobe. 2.  Suboptimal opacification of the pulmonary arterial tree. No gross pulmonary embolic disease is observed.    Xr Chest Port    Result Date: 02/24/2017  EXAM:  XR CHEST PORT dated 02/24/2017 8:31 AM INDICATION:  pre op. Nurses  note states patient coughing up blood-tinged sputum. COMPARISON:  None. FINDINGS: A portable AP semierect radiograph of the chest was obtained at 0819 hours. There are bilateral lower lung zone infiltrates slightly more prominent on the left than the right. There is no obscuration of the diaphragm or of the heart border, and localization of the infiltrates is difficult. Cannot rule out overlying pleural disease. The upper lung zones are clear. Heart size is normal. No hilar or mediastinal mass.     IMPRESSION:  Bilateral lower lung zone densities consistent with bilateral infiltrates. Consider conventional PA and lateral views for better characterization.       Assessment: Suspect abscess formation    Today's lab was noted.  Plan:  Continue current meds    Monitor culture, fluid and electrolyte.    PA and Lateral chest X-ray   `

## 2017-02-26 NOTE — Progress Notes (Signed)
Bedside and Verbal shift change report given to T.Earl LitesGregory RN (Cabin crewoncoming nurse) by NLumpkin RN Physiological scientist(offgoing nurse). Report included the following information SBAR, Kardex, Intake/Output and MAR. Made aware no new orders for increased T.

## 2017-02-26 NOTE — Progress Notes (Signed)
Reason for Admission: Fever  Abdominal Pain  Influenza      Problem List:   Hospital Problems  Date Reviewed: 02/23/2017          Codes Class Noted POA    * (Principal)Influenza ICD-10-CM: J11.1  ICD-9-CM: 487.1  02/23/2017 Yes        CAP (community acquired pneumonia) ICD-10-CM: J18.9  ICD-9-CM: 486  02/23/2017 Yes              Background:    Past Medical History: No past medical history on file.                Telemetry:                                     Pain Assessment:  Pain Intensity 1: 6 (02/26/17 0440)  Pain Location 1: Flank   Pain Intervention(s) 1: Medication (see MAR)    Patient Stated Pain Goal: 0            Time of last Intervention: 3 hours  Effectiveness of intervention: some relief   Other Actions to relieve pain:              Summary of Shift :    Dr Dwyane DeeAtnafu will assess pt T. When he does his rounds.

## 2017-02-27 LAB — METABOLIC PANEL, COMPREHENSIVE
A-G Ratio: 0.6 — ABNORMAL LOW (ref 1.0–3.1)
ALT (SGPT): 73 U/L (ref 12.0–78.0)
AST (SGOT): 66 U/L — ABNORMAL HIGH (ref 15–37)
Albumin: 1.8 g/dL — ABNORMAL LOW (ref 3.40–5.00)
Alk. phosphatase: 109 U/L (ref 46–116)
Anion gap: 14 mmol/L (ref 10–17)
BUN/Creatinine ratio: 9 (ref 6.0–20.0)
BUN: 10 MG/DL (ref 7–18)
Bilirubin, total: 3.6 MG/DL — ABNORMAL HIGH (ref 0.20–1.00)
CO2: 27 mmol/L (ref 21–32)
Calcium: 7.1 MG/DL — ABNORMAL LOW (ref 8.5–10.1)
Chloride: 97 mmol/L — ABNORMAL LOW (ref 98–107)
Creatinine: 1.08 MG/DL (ref 0.6–1.3)
GFR est AA: >60 mL/min/{1.73_m2} (ref >60)
GFR est non-AA: >60 mL/min/{1.73_m2} (ref >60)
Globulin: 3 g/dL
Glucose: 95 mg/dL (ref 74–106)
Potassium: 3.5 mmol/L (ref 3.50–5.10)
Protein, total: 4.8 g/dL — ABNORMAL LOW (ref 6.40–8.20)
Sodium: 134 mmol/L — ABNORMAL LOW (ref 136–145)

## 2017-02-27 LAB — MAGNESIUM: Magnesium: 1.8 mg/dL (ref 1.8–2.4)

## 2017-02-27 LAB — CULTURE, RESPIRATORY/SPUTUM/BRONCH W GRAM STAIN

## 2017-02-27 LAB — CBC WITH AUTOMATED DIFF
ABS. BASOPHILS: 0 10*3/uL (ref 0.0–0.2)
ABS. EOSINOPHILS: 0 10*3/uL (ref 0.0–0.7)
ABS. LYMPHOCYTES: 1.1 10*3/uL — ABNORMAL LOW (ref 1.2–3.4)
ABS. MONOCYTES: 1.9 10*3/uL (ref 1.1–3.2)
ABS. NEUTROPHILS: 5.1 10*3/uL (ref 1.4–6.5)
BASOPHILS: 1 % (ref 0–2)
EOSINOPHILS: 0 % (ref 0–5)
HCT: 35.1 % — ABNORMAL LOW (ref 36.8–45.2)
HGB: 12.2 g/dL — ABNORMAL LOW (ref 12.8–15.0)
IMMATURE GRANULOCYTES: 1 % (ref 0.0–5.0)
LYMPHOCYTES: 13 % — ABNORMAL LOW (ref 16–40)
MCH: 28.6 PG (ref 27–31)
MCHC: 34.8 g/dL (ref 32–36)
MCV: 82.2 FL (ref 81–99)
MONOCYTES: 23 % — ABNORMAL HIGH (ref 0–12)
MPV: 10.6 FL — ABNORMAL HIGH (ref 7.4–10.4)
NEUTROPHILS: 63 % (ref 40–70)
NRBC: 0 PER 100 WBC
PLATELET: 152 10*3/uL (ref 140–450)
RBC: 4.27 M/uL (ref 4.0–5.2)
RDW: 13.3 % (ref 11.5–14.5)
WBC: 8.1 10*3/uL (ref 4.8–10.8)

## 2017-02-27 LAB — BILIRUBIN,INDIRECT: Bilirubin, indirect: 1.5 MG/DL — ABNORMAL HIGH (ref 0.0–0.4)

## 2017-02-27 LAB — BILIRUBIN, DIRECT: Bilirubin, direct: 2.1 MG/DL — ABNORMAL HIGH (ref 0.0–0.20)

## 2017-02-27 MED ORDER — PHARMACY VANCOMYCIN NOTE
Freq: Once | Status: AC
Start: 2017-02-27 — End: 2017-02-27
  Administered 2017-02-27: 22:00:00

## 2017-02-27 MED FILL — OXYCODONE-ACETAMINOPHEN 5 MG-325 MG TAB: 5-325 mg | ORAL | Qty: 2

## 2017-02-27 MED FILL — DEXTROSE 5% IN NORMAL SALINE IV: INTRAVENOUS | Qty: 1000

## 2017-02-27 MED FILL — PHARMACY VANCOMYCIN NOTE: Qty: 1

## 2017-02-27 MED FILL — VANCOMYCIN 10 GRAM IV SOLR: 10 gram | INTRAVENOUS | Qty: 1000

## 2017-02-27 MED FILL — VANCOMYCIN 1,000 MG IV SOLR: 1000 mg | INTRAVENOUS | Qty: 1000

## 2017-02-27 MED FILL — PIPERACILLIN-TAZOBACTAM 3.375 GRAM IV SOLR: 3.375 gram | INTRAVENOUS | Qty: 3.38

## 2017-02-27 MED FILL — ENOXAPARIN 40 MG/0.4 ML SUB-Q SYRINGE: 40 mg/0.4 mL | SUBCUTANEOUS | Qty: 0.4

## 2017-02-27 MED FILL — TAMIFLU 75 MG CAPSULE: 75 mg | ORAL | Qty: 1

## 2017-02-27 MED FILL — SENNA LAX 8.6 MG TABLET: 8.6 mg | ORAL | Qty: 1

## 2017-02-27 NOTE — Other (Signed)
Pt remains with a low grade temp with a frequent productive cough. Pt's skin feels warm and clammy and he remains covered with 2 blankets.  He has refused a bath or to have his linens changed today. He is noted to have a poor appetite and a nutrition consult was placed.  Pt ws ordered a fruit plate for every meal as per his preference.

## 2017-02-27 NOTE — Progress Notes (Signed)
Assumed care of patient.  Patient felt warm to touch, upon assessment of vital temp 103.  Notified MD and order was provided for tylenol 650 mg by mouth and not to exceed 4grams.  Patient received 2 tabs of Percocet for pain.  Ice packs was provided and PO fluids to decrease temperature.

## 2017-02-27 NOTE — Other (Signed)
Verbal shift change report given to Allison RN (oncoming nurse) by Anisse RN (offgoing nurse). Report included the following information SBAR and Kardex.

## 2017-02-27 NOTE — Other (Signed)
Reason for Admission: Fever  Abdominal Pain  Influenza      Problem List:   Hospital Problems  Date Reviewed: 02/23/2017          Codes Class Noted POA    * (Principal)Influenza ICD-10-CM: J11.1  ICD-9-CM: 487.1  02/23/2017 Yes        CAP (community acquired pneumonia) ICD-10-CM: J18.9  ICD-9-CM: 486  02/23/2017 Yes              Background:    Past Medical History: No past medical history on file.             Pain Assessment:  Pain Intensity 1: 2 (02/27/17 1938)  Pain Location 1: Back   Pain Intervention(s) 1: Medication (see MAR)    Patient Stated Pain Goal: 0            Time of last Intervention: 4 hours  Effectiveness of intervention: some relief               Summary of Shift :   Bedside and Verbal shift change report given to Arlys JohnBrian (Cabin crewoncoming nurse) by Revonda StandardAllison (offgoing nurse). Report included the following information SBAR, Kardex, Intake/Output, MAR, Accordion and Recent Results.

## 2017-02-27 NOTE — Progress Notes (Signed)
Patient Active Problem List   Diagnosis Code   ??? Influenza J11.1   ??? CAP (community acquired pneumonia) J18.9       Subjective: No new complaints    Looks comfortable    CP decreased, I think the part of lung is now necrotic    Fever and hemoptysis persisted    No SOB    Physical Examination:  Temp (24hrs), Avg:100.7 ??F (38.2 ??C), Min:99.8 ??F (37.7 ??C), Max:103 ??F (39.4 ??C)      Visit Vitals   ??? BP 110/72 (BP 1 Location: Left arm, BP Patient Position: At rest)   ??? Pulse 92   ??? Temp 99.8 ??F (37.7 ??C)   ??? Resp 16   ??? Ht 6\' 3"  (1.905 m)   ??? Wt 72.6 kg (160 lb)   ??? SpO2 97%   ??? BMI 20 kg/m2       GENERAL:  In no acute distress, comfortable, lying in bed.  SKIN:   Warm, moist.  Normal skin turgor.  No rash or scratch mark noted.   HEENT:   Normocephalic, Atraumatic,     Pink conjunctiva, Anicteric, EOMI,     No sinus tenderness or stuffy nose and     Clear & moist oral mucosa.  NECK: Supple, no JVD, no thyroidmegammy or lymphadenopathy.  AXILLA: No Lymphadenopathy.  LUNGS:   Symmetrical, Resonant and Lowe lung crackles BL. No wheezes, rales,or rhonchi,  CARDIAC:  S1 & S2, RRR. No murmurs, rubs or gallops heard  ABDOMEN:   Moves with respiration. Soft, NT, BS +.   INGUINAL: No hernia or lymphadenopathy.   GUS:  No supra pubic area tenderness. No CVAT.  EXTREM.:   No edema.   NEURO:   AOX3, No focal deficits..        Labs Reviewed:  Recent Labs      02/27/17   0600  02/26/17   0900  02/25/17   1100   WBC  8.1  7.3  7.9   HGB  12.2*  12.2*  14.1   HCT  35.1*  34.3*  39.8   PLT  152  122*  110*     Recent Labs      02/27/17   0600  02/26/17   0900  02/25/17   1100   NA  134*  131*  135*   K  3.5  3.5  3.6   CL  97*  95*  97*   CO2  27  27  26    GLU  95  113*  131*   BUN  10  14  14    CREA  1.08  1.25  1.06   CA  7.1*  7.4*  7.7*   MG  1.8  2.1  2.2   ALB  1.8*  1.8*  2.1*   SGOT  66*  88*  59*   ALT  73  71  71     No components found for: GLPOC  No results for input(s): PH, PCO2, PO2, HCO3, FIO2 in the last 72 hours.   Recent Labs      02/25/17   1100   INR  1.2       Culture  All Micro Results     Procedure Component Value Units Date/Time    CULTURE, BLOOD [[id number]] Collected:  02/27/17 1830    Order Status:  Completed Specimen:  Blood from Blood Updated:  02/27/17 1835    CULTURE, RESPIRATORY/SPUTUM/BRONCH Gay Filler STAIN [[id number]]  (  Abnormal)  (Susceptibility) Collected:  02/25/17 1930    Order Status:  Completed Specimen:  Sputum from Sputum Updated:  02/27/17 1232     Special Requests: NO SPECIAL REQUESTS        GRAM STAIN         MANY POLYMORPHONUCLEAR LEUKOCYTES (PMNs)              MODERATE GRAM POS COCCI IN CLUSTERS     Culture result:         HEAVY **METHICILLIN RESISTANT STAPHYLOCOCCUS AUREUS** PATIENT IS A KNOWN MRSA (A)              MODERATE ALPHA STREPTOCOCCUS, NOT S. PNEUMONIAE (A)    CULTURE, BLOOD [[id number]]  (Abnormal) Collected:  02/25/17 0900    Order Status:  Completed Specimen:  Whole Blood from Blood Updated:  02/27/17 1213     Special Requests: NO SPECIAL REQUESTS        GRAM STAIN         GRAM POS COCCI IN CLUSTERS AEROBIC BOTTLE CALLED RESULTS TO/VERIFIED READ BACK WITH NURSE TODD GREGORY AT 1255 ON 02/26/17 EXT.3426     Culture result:         **METHICILLIN RESISTANT STAPHYLOCOCCUS AUREUS** AEROBIC BOTTLE CALLED RESULTS TO/VERIFIED READ BACK WITH NURSE ALLISON VANDECRUIZE AT 1211 ON 02/27/17 EXT.3426 INFECTION CONTROL NOTIFIED (A)    CULTURE, BLOOD [[id number]] Collected:  02/24/17 0430    Order Status:  Completed Specimen:  Whole Blood from Blood Updated:  02/27/17 1131     Special Requests: NO SPECIAL REQUESTS        Culture result: NO GROWTH 3 DAYS       CULTURE, URINE [[id number]] Collected:  02/24/17 0630    Order Status:  Completed Specimen:  Urine from Foley Specimen Updated:  02/26/17 1237     Special Requests: NO SPECIAL REQUESTS        Culture result: NO GROWTH 48 HOURS             Urine  Color   Date Value Ref Range Status   02/24/2017 YELLOW   Final     Appearance   Date Value Ref Range Status    02/24/2017 CLEAR   Final     Specific gravity   Date Value Ref Range Status   02/24/2017 1.028   Final     pH (UA)   Date Value Ref Range Status   02/24/2017 5.5 5.0 - 7.0   Final     Protein   Date Value Ref Range Status   02/24/2017 100 (A) NEG mg/dL Final     Ketone   Date Value Ref Range Status   02/24/2017 TRACE (A) NEG mg/dL Final     Bilirubin   Date Value Ref Range Status   02/24/2017 NEGATIVE  NEG   Final     Blood   Date Value Ref Range Status   02/24/2017 MODERATE (A) NEG   Final     Urobilinogen   Date Value Ref Range Status   02/24/2017 1.0 0.2 - 1.0 EU/dL Final     Nitrites   Date Value Ref Range Status   02/24/2017 NEGATIVE  NEG   Final     Leukocyte Esterase   Date Value Ref Range Status   02/24/2017 NEGATIVE  NEG   Final       Radiology Studies:  Xr Chest Pa Lat    Result Date: 02/26/2017  EXAM:  XR CHEST PA LAT dated 02/26/2017 10:31 AM  INDICATION:   Persistent fever suspect empyema or abscess. COMPARISON: Chest AP semierect film from 02/24/2017.Chest CT scan 02/25/17. PA and lateral radiographs of the chest.  Dense infiltrate or consolidation in the posterior right midlung adjacent to the fissure in the superior segment right lower lobe measuring approximately 8.6 x 7.3 x 7.3 cm has  increased since previous examination 02/24/17.  Infiltrate left lower lobe has increased.  Moderate consolidation left lower lobe again noted Lungs are normally inflated.  Normal heart size..  Right hilar prominence compatible with small nodes unchanged. Marland Kitchen.     IMPRESSION: Increasing consolidation posterior right midlung and increasing infiltrate left lower lobe since previous examination 02/24/17.  Findings are unchanged since CT scan of 02/25/17. Dense consolidation left lower lobe measuring 4.9 cm in diameter, focal consolidation vs. Beginning of an abscess. Close follow-up examination suggested.  Some distention of colon with air unchanged.     Xr Spine Lumb Min 4 V    Result Date: 02/25/2017   EXAM:  XR SPINE LUMB MIN 4 V dated 02/25/2017 12:28 PM INDICATION:   LBP COMPARISON:  None. FINDINGS:  AP, lateral, and oblique views of the lumbar spine were obtained. No fracture or subluxation. No disc space narrowing. No arthritic changes. No osteolytic or osteoblastic lesion. SI joints are unremarkable. There is opacification of both renal collecting system and ureters from the intravenous contrast injection of the prior CT.     IMPRESSION:   Normal exam.     Ct Chest W Cont    Result Date: 02/25/2017  CT CHEST W CONT dated 02/25/2017 12:19 PM INDICATION:  Pneumonia, Hematemesis COMPARISON: None TECHNIQUE: Spiral CT with IV contrast bolus through the thorax was performed. 2.5 mm axial images with sagittal and coronal reconstructions produced. Contrast material 100 mL Isovue 300. One or more of the following dose reduction techniques were used: automated exposure control, adjustment of the mA and/or kV according to patient size, use of iterative reconstruction technique. INTERPRETATION:  There is less than optimal opacification of the pulmonary arterial tree. No definite pulmonary emboli can be seen. The aorta is unremarkable. There are dense consolidative infiltrates in both lower lobes, and there is patchy infiltration in the left upper lobe. No definite pleural effusion is seen. Multiple air bronchograms are seen in the lower lobes. No hilar or mediastinal mass is identified. The tracheobronchial tree is unremarkable. The thyroid is unremarkable. No cervical lymphadenopathy is seen. No chest wall abnormality is observed. Limited images the upper abdomen show no significant abnormality. The visualized skeletal structures are unremarkable.     IMPRESSION: 1.  Dense bilateral lower lobe infiltrates consistent with pneumonia. There is also patchy infiltration in the left upper lobe. 2.  Suboptimal opacification of the pulmonary arterial tree. No gross pulmonary embolic disease is observed.    Xr Chest Port     Result Date: 02/24/2017  EXAM:  XR CHEST PORT dated 02/24/2017 8:31 AM INDICATION:  pre op. Nurses note states patient coughing up blood-tinged sputum. COMPARISON:  None. FINDINGS: A portable AP semierect radiograph of the chest was obtained at 0819 hours. There are bilateral lower lung zone infiltrates slightly more prominent on the left than the right. There is no obscuration of the diaphragm or of the heart border, and localization of the infiltrates is difficult. Cannot rule out overlying pleural disease. The upper lung zones are clear. Heart size is normal. No hilar or mediastinal mass.     IMPRESSION:  Bilateral lower lung zone densities consistent with bilateral  infiltrates. Consider conventional PA and lateral views for better characterization.         Assessment: MRSA Bacteremia    MRSA post viral pneumonia    Abscess forming    Today's lab was noted.    Plan:  Continue current meds    Monitor culture, fluid and electrolyte.    May repeat CT of the chest.    ECHO   `

## 2017-02-27 NOTE — Progress Notes (Signed)
Pharmacy Dosing Services: Vancomycin    Consult for Vancomycin Dosing by Pharmacy by Dr. Dwyane DeeAtnafu  Indication: Post Viral CAP(MRSA suspected)  Target level: 15-20  Day of Therapy 4    Previous Regimen Vanc 1gm iv q8hrs   Last Level Trough level of 8.4 on 02/25/17   Other Current Antibiotics Zosyn 3.375gm iv q8hrs   Significant Cultures Sputum culture on 02/24/17:gram +ve cocci    Serum Creatinine Lab Results   Component Value Date/Time    Creatinine 1.08 02/27/2017 06:00 AM      Creatinine Clearance Estimated Creatinine Clearance: 94.3 mL/min (based on Cr of 1.08).   BUN Lab Results   Component Value Date/Time    BUN 10 02/27/2017 06:00 AM       WBC Lab Results   Component Value Date/Time    WBC 8.1 02/27/2017 06:00 AM      H/H    Lab Results   Component Value Date/Time    HGB 12.2 (L) 02/27/2017 06:00 AM      Platelets Lab Results   Component Value Date/Time    PLATELET 152 02/27/2017 06:00 AM      Temp (!) 103.2 ??F (39.6 ??C)       Dose administration notes:   Doses given appropriately as scheduled    Plan for level:none,level pending  Adjustments:  none  Plan:  Level pending,will adjust dose based on level.Will Continue to monitor

## 2017-02-28 ENCOUNTER — Inpatient Hospital Stay: Admit: 2017-02-28 | Payer: MEDICAID | Primary: Family Medicine

## 2017-02-28 LAB — CULTURE, BLOOD: Culture result:: NO GROWTH

## 2017-02-28 MED ORDER — FLU VACCINE QV 2017-18 (36 MOS+)(PF) 60 MCG (15 MCG X 4)/0.5 ML IM SYRINGE
60 mcg (15 mcg x 4)/0.5 mL | INTRAMUSCULAR | Status: DC
Start: 2017-02-28 — End: 2017-03-12

## 2017-02-28 MED ORDER — SODIUM CHLORIDE 0.9 % IV
10 gram | Freq: Two times a day (BID) | INTRAVENOUS | Status: DC
Start: 2017-02-28 — End: 2017-03-03
  Administered 2017-03-01 – 2017-03-03 (×6): via INTRAVENOUS

## 2017-02-28 MED ORDER — IPRATROPIUM-ALBUTEROL 2.5 MG-0.5 MG/3 ML NEB SOLUTION
2.5 mg-0.5 mg/3 ml | Freq: Four times a day (QID) | RESPIRATORY_TRACT | Status: DC
Start: 2017-02-28 — End: 2017-03-05
  Administered 2017-02-28 – 2017-03-05 (×15): via RESPIRATORY_TRACT

## 2017-02-28 MED ORDER — ACETAMINOPHEN 325 MG TABLET
325 mg | Freq: Four times a day (QID) | ORAL | Status: DC | PRN
Start: 2017-02-28 — End: 2017-03-12
  Administered 2017-02-28 – 2017-03-11 (×14): via ORAL

## 2017-02-28 MED ORDER — PIPERACILLIN-TAZOBACTAM 3.375 GRAM IV SOLR
3.375 gram | Freq: Three times a day (TID) | INTRAVENOUS | Status: DC
Start: 2017-02-28 — End: 2017-03-04
  Administered 2017-02-28 – 2017-03-04 (×13): via INTRAVENOUS

## 2017-02-28 MED FILL — IPRATROPIUM-ALBUTEROL 2.5 MG-0.5 MG/3 ML NEB SOLUTION: 2.5 mg-0.5 mg/3 ml | RESPIRATORY_TRACT | Qty: 3

## 2017-02-28 MED FILL — VANCOMYCIN 10 GRAM IV SOLR: 10 gram | INTRAVENOUS | Qty: 1000

## 2017-02-28 MED FILL — VANCOMYCIN 5 GRAM IV SOLR: 5 gram | INTRAVENOUS | Qty: 1250

## 2017-02-28 MED FILL — SENNA LAX 8.6 MG TABLET: 8.6 mg | ORAL | Qty: 1

## 2017-02-28 MED FILL — ZOSYN 3.375 GRAM INTRAVENOUS SOLUTION: 3.375 gram | INTRAVENOUS | Qty: 3.38

## 2017-02-28 MED FILL — OXYCODONE-ACETAMINOPHEN 5 MG-325 MG TAB: 5-325 mg | ORAL | Qty: 2

## 2017-02-28 MED FILL — ENOXAPARIN 40 MG/0.4 ML SUB-Q SYRINGE: 40 mg/0.4 mL | SUBCUTANEOUS | Qty: 0.4

## 2017-02-28 MED FILL — TAMIFLU 75 MG CAPSULE: 75 mg | ORAL | Qty: 1

## 2017-02-28 MED FILL — TYLENOL 325 MG TABLET: 325 mg | ORAL | Qty: 2

## 2017-02-28 MED FILL — PIPERACILLIN-TAZOBACTAM 3.375 GRAM IV SOLR: 3.375 gram | INTRAVENOUS | Qty: 3.38

## 2017-02-28 MED FILL — DEXTROSE 5% IN NORMAL SALINE IV: INTRAVENOUS | Qty: 1000

## 2017-02-28 NOTE — Progress Notes (Addendum)
Consult for Vancomycin Dosing by Pharmacy by Dr. Dwyane DeeAtnafu  Indication:post-viral CAP / MRSA suspected  Target Level:15-20  Day of Therapy :5  Previous Regimen 1000 mg q8h   Last Level 8.4 on 2/27   Other Current Antibiotics Zosyn 3.375 gm q8h   Significant Cultures MRSA+ve (blood cul on 2/27); MRSA+ve, Alpha strep, not S. Pneumoniae(Sputum cul on 2/27)   Serum Creatinine Lab Results   Component Value Date/Time    Creatinine 1.08 02/27/2017 06:00 AM       Creatinine Clearance Estimated Creatinine Clearance: 94.3 mL/min (based on Cr of 1.08).   BUN Lab Results   Component Value Date/Time    BUN 10 02/27/2017 06:00 AM       WBC Lab Results   Component Value Date/Time    WBC 8.1 02/27/2017 06:00 AM       H/H Lab Results   Component Value Date/Time    HGB 12.2 (L) 02/27/2017 06:00 AM      Platelets Lab Results   Component Value Date/Time    PLATELET 152 02/27/2017 06:00 AM      Temp Temp: 98.7 ??F (37.1 ??C)       Dose administration notes:   Doses not given as scheduled    Plan for level:Trough today was 8.4  Adjustments: Change Vancomycin 1000 mg q8h to 1250 mg q12h  Plan:  Continue to monitor

## 2017-02-28 NOTE — Progress Notes (Signed)
Problem: Nutrition Deficit  Goal: *Optimize nutritional status  Outcome: Progressing Towards Goal  Pt will receive Ensure Enlive TID and Magic Cup TID to promote PO intake and help meet est nutritional needs.

## 2017-02-28 NOTE — Progress Notes (Signed)
Bedside shift change report given to Revonda StandardAllison  (Cabin crewoncoming nurse) by Shelva MajesticWest, Museum/gallery conservatorN (offgoing nurse). Report included the following information MAR and Med Rec Status.

## 2017-02-28 NOTE — Progress Notes (Signed)
NUTRITION initial assessment    PLAN:   1. Continue current Regular Diet as tolerated.  2. Will add Ensure Enlive (strawberry) TID + Magic Cup TID.  3. Monitor %po intake and electrolyte balance and maintain to within normal limits.  4. RD to follow per protocol.    GOALS:   1. Consume 75% or greater of all meals and supplements within 4 days.    SUBJECTIVE/OBJECTIVE:   Pt seen today based on MD consult for general nutrition management and supplements. Per discussion with pt, poor appetite since admission and has only started eating x 2 days ago. Pt reports poor appetite PTA x 2 days, meaning pt has had <50% est energy requirements x 5 days. Pt reports wt loss r/t poor appetite but unsure of amount of wt lost. No recent measured wt on file. Pt with covers pulled to neck, unable to assess for physical signs of malnutrition, however pt with slight temporal wasting. Pt reports he only eats fruits and observed <25% of fruit platter for lunch consumed. Pt willing to receive Ensure and Magic Cup to help meet est nutritional needs. Pt admits to now resolved diarrhea, no other GI complaints at this time. No known food allergies. No wounds noted. Hyponatremia noted. Currently on IVF.     Dx: Influenza  PMH: influenza, CAP  Diet: Regular  Medications: [x]  Reviewed  (senna)  Gtts: D5NS at 117ml/hr (provides 408kcals/day)  Allergies: [x]  Reviewed      Labs:    Lab Results   Component Value Date/Time    Sodium 134 (L) 02/27/2017 06:00 AM    Potassium 3.5 02/27/2017 06:00 AM    Chloride 97 (L) 02/27/2017 06:00 AM    CO2 27 02/27/2017 06:00 AM    Anion gap 14 02/27/2017 06:00 AM    Glucose 95 02/27/2017 06:00 AM    BUN 10 02/27/2017 06:00 AM    Creatinine 1.08 02/27/2017 06:00 AM    Calcium 7.1 (L) 02/27/2017 06:00 AM    Magnesium 1.8 02/27/2017 06:00 AM    Albumin 1.8 (L) 02/27/2017 06:00 AM         Intake/Output Summary (Last 24 hours) at 02/28/17 1356  Last data filed at 02/28/17 1235   Gross per 24 hour    Intake          4468.32 ml   Output             4200 ml   Net           268.32 ml       Anthropometrics:  ,  , BMI (calculated): 20  Last 3 Recorded Weights in this Encounter    02/23/17 2117   Weight: 72.6 kg (160 lb)      Ht Readings from Last 1 Encounters:   02/23/17 6\' 3"  (1.905 m)           Estimated Nutrition Needs: [x]  MSJ []  [facility] [address] 2003b []  Other ____________    1610 Kcals/day (1.2-1.3 stress factor= 2072-2245kcals/day), Protein (g):  (0.8-1gm/kg= 58-73gm/day) Fluid (ml):  (83ml/kcal= 2072-2250ml/day)  Based on:   [x]   Actual BW (72.6kg)   []   IBW   []  Adjusted BW      Wt Status:  []  Underweight (<18.5) [x]  Normal Weight (18.5 - 24.9) []  Overweight (25 - 29.9) []  Mild Obesity (30 - 34.9)  []  Moderate Obesity (35 - 39.9) []  Morbid Obesity (40+)     []  Moderate Malnutrition []  Severe Malnutrition in the context of _____________  Feeding Limitations:  []  Difficulty chewing []  Difficulty swallowing []  Assist with feeding []  Total feed                ASSESSMENT:   BMI of 20 consistent with normal weight per standards.    Nutrition Diagnoses:   Inadequate oral intake related to poor appetite as evidenced by 25% consumption of meals, <50% est energy requirements x 5 days, slight temporal wasting.    Nutrition Interventions:  Intervention :Food/Nutrient Delivery: Yes  Recommended Diet/Supplements: Continue current diet   Commercial Supplements    Education needs:   [x]   No educational needs identified    []   The following education needs were addressed: _____________      [x]   No cultural, religious, or ethnic dietary needs identified    []   Cultural, religious and ethnic food preferences identified and addressed      [x]   Participated in care plan, discharge planning/Interdisciplinary rounds. Discharge plan: __General healthy diet__     Nutrition Risk:  [x]  High  []  Moderate []   Low    Francisco Capuchinhuythu M Lekha Dancer, RD, LDN  EXT [phone]

## 2017-02-28 NOTE — Progress Notes (Signed)
Patient Active Problem List   Diagnosis Code   ??? CAP (community acquired pneumonia) J18.9     Late entry     Subjective: Pain and hemoptysis worsened    Physical Examination:  Temp (24hrs), Avg:98.7 ??F (37.1 ??C), Min:98.3 ??F (36.8 ??C), Max:99.1 ??F (37.3 ??C)      Visit Vitals   ??? BP (!) 150/104 (BP 1 Location: Left arm, BP Patient Position: At rest)   ??? Pulse 84   ??? Temp 98.7 ??F (37.1 ??C)   ??? Resp 18   ??? Ht 6\' 3"  (1.905 m)   ??? Wt 72.6 kg (160 lb)   ??? SpO2 96%   ??? BMI 20 kg/m2       GENERAL:  In no acute distress, comfortable, lying in bed.  SKIN:   Warm, moist.  Normal skin turgor.  No rash or scratch mark noted.   HEENT:   Normocephalic, Atraumatic,     Pink conjunctiva, Anicteric, EOMI,     No sinus tenderness or stuffy nose and     Clear & moist oral mucosa.  NECK: Supple, no JVD, no thyroidmegammy or lymphadenopathy.  AXILLA: No Lymphadenopathy.  LUNGS:   Symmetrical, Resonant and CTA BL. No wheezes, rales,or rhonchi,  CARDIAC:  S1 & S2, RRR. No murmurs, rubs or gallops heard  ABDOMEN:   Moves with respiration. Soft, NT, BS +.   INGUINAL: No hernia or lymphadenopathy.   GUS:  No supra pubic area tenderness. No CVAT.  EXTREM.:   No edema.   NEURO:   AOX3, No focal deficits..        Labs Reviewed:  No results for input(s): WBC, HGB, HGBEXT, HCT, HCTEXT, PLT, PLTEXT, HGBEXT, HCTEXT, PLTEXT in the last 72 hours.  No results for input(s): NA, K, CL, CO2, GLU, BUN, CREA, CA, MG, PHOS, ALB, TBIL, SGOT, ALT in the last 72 hours.  No components found for: GLPOC  No results for input(s): PH, PCO2, PO2, HCO3, FIO2 in the last 72 hours.  No results for input(s): INR in the last 72 hours.    No lab exists for component: INREXT, INREXT    Culture  All Micro Results     Procedure Component Value Units Date/Time    CULTURE, BLOOD [[id number]] Collected:  02/27/17 1830    Order Status:  Completed Specimen:  Blood from Blood Updated:  03/02/17 1508     Special Requests: NO SPECIAL REQUESTS         Culture result: NO GROWTH 3 DAYS       CULTURE, BLOOD [[id number]] Collected:  02/24/17 0430    Order Status:  Completed Specimen:  Whole Blood from Blood Updated:  03/02/17 1507     Special Requests: NO SPECIAL REQUESTS        Culture result: NO GROWTH 6 DAYS       CULTURE, BLOOD [[id number]]  (Abnormal)  (Susceptibility) Collected:  02/25/17 0900    Order Status:  Completed Specimen:  Whole Blood from Blood Updated:  02/28/17 1152     Special Requests: NO SPECIAL REQUESTS        GRAM STAIN         GRAM POS COCCI IN CLUSTERS AEROBIC BOTTLE CALLED RESULTS TO/VERIFIED READ BACK WITH NURSE TODD GREGORY AT 1255 ON 02/26/17 [id number]     Culture result:         **METHICILLIN RESISTANT STAPHYLOCOCCUS AUREUS** AEROBIC BOTTLE (A)              REMAINING BOTTLE(S)  HAS/HAVE NO GROWTH SO FAR    CULTURE, RESPIRATORY/SPUTUM/BRONCH Gay Filler STAIN [[id number]]  (Abnormal)  (Susceptibility) Collected:  02/25/17 1930    Order Status:  Completed Specimen:  Sputum from Sputum Updated:  02/27/17 1232     Special Requests: NO SPECIAL REQUESTS        GRAM STAIN         MANY POLYMORPHONUCLEAR LEUKOCYTES (PMNs)              MODERATE GRAM POS COCCI IN CLUSTERS     Culture result:         HEAVY **METHICILLIN RESISTANT STAPHYLOCOCCUS AUREUS** PATIENT IS A KNOWN MRSA (A)              MODERATE ALPHA STREPTOCOCCUS, NOT S. PNEUMONIAE (A)    CULTURE, URINE [[id number]] Collected:  02/24/17 0630    Order Status:  Completed Specimen:  Urine from Foley Specimen Updated:  02/26/17 1237     Special Requests: NO SPECIAL REQUESTS        Culture result: NO GROWTH 48 HOURS             Urine  Color   Date Value Ref Range Status   02/24/2017 YELLOW   Final     Appearance   Date Value Ref Range Status   02/24/2017 CLEAR   Final     Specific gravity   Date Value Ref Range Status   02/24/2017 1.028   Final     pH (UA)   Date Value Ref Range Status   02/24/2017 5.5 5.0 - 7.0   Final     Protein   Date Value Ref Range Status   02/24/2017 100 (A) NEG mg/dL Final      Ketone   Date Value Ref Range Status   02/24/2017 TRACE (A) NEG mg/dL Final     Bilirubin   Date Value Ref Range Status   02/24/2017 NEGATIVE  NEG   Final     Blood   Date Value Ref Range Status   02/24/2017 MODERATE (A) NEG   Final     Urobilinogen   Date Value Ref Range Status   02/24/2017 1.0 0.2 - 1.0 EU/dL Final     Nitrites   Date Value Ref Range Status   02/24/2017 NEGATIVE  NEG   Final     Leukocyte Esterase   Date Value Ref Range Status   02/24/2017 NEGATIVE  NEG   Final       Radiology Studies:  Xr Chest Pa Lat    Result Date: 02/26/2017  EXAM:  XR CHEST PA LAT dated 02/26/2017 10:31 AM INDICATION:   Persistent fever suspect empyema or abscess. COMPARISON: Chest AP semierect film from 02/24/2017.Chest CT scan 02/25/17. PA and lateral radiographs of the chest.  Dense infiltrate or consolidation in the posterior right midlung adjacent to the fissure in the superior segment right lower lobe measuring approximately 8.6 x 7.3 x 7.3 cm has  increased since previous examination 02/24/17.  Infiltrate left lower lobe has increased.  Moderate consolidation left lower lobe again noted Lungs are normally inflated.  Normal heart size..  Right hilar prominence compatible with small nodes unchanged. Marland Kitchen     IMPRESSION: Increasing consolidation posterior right midlung and increasing infiltrate left lower lobe since previous examination 02/24/17.  Findings are unchanged since CT scan of 02/25/17. Dense consolidation left lower lobe measuring 4.9 cm in diameter, focal consolidation vs. Beginning of an abscess. Close follow-up examination suggested.  Some distention of colon with  air unchanged.     Xr Spine Lumb Min 4 V    Result Date: 02/25/2017  EXAM:  XR SPINE LUMB MIN 4 V dated 02/25/2017 12:28 PM INDICATION:   LBP COMPARISON:  None. FINDINGS:  AP, lateral, and oblique views of the lumbar spine were obtained. No fracture or subluxation. No disc space narrowing.  No arthritic changes. No osteolytic or osteoblastic lesion. SI joints are unremarkable. There is opacification of both renal collecting system and ureters from the intravenous contrast injection of the prior CT.     IMPRESSION:   Normal exam.     Ct Chest Wo Cont    Result Date: 02/28/2017  EXAM:  CT CHEST WO CONT  dated 02/28/2017 2:38 PM INDICATION:  MRSA pneumonia worsening suspect abscess formation COMPARISON: CT scan the chest with contrast injection 02/25/17.  CONTRAST:  None. TECHNIQUE: Spiral CT through the thorax without contrast was performed. 2.5 mm axial images with sagittal and coronal reconstructions produced. CT dose reduction was achieved through use of a standardized protocol tailored for this examination and automatic exposure control for dose modulation. The absence of intravenous contrast prevents evaluation for pulmonary emboli or aortic dissection. FINDINGS:Small bulla in the left and right upper lobes.  Focal infiltrate in the anterior part of the left upper lobe unchanged since the previous examination. Bilateral basilar consolidation again noted.  On the right side it measures approximately 10 cm in craniocaudal dimension unchanged since the previous examination.  On the left side it measures approximately 10 cm , increasing from approximately 9 cm on the previous study.  No significant pleural effusion or empyema detected at this time.  No cavitary lesions other than small bulla within the lung fields.\\ 7.1 mm anterior inferior pericardial effusion again noted.  Right hemidiaphragm elevation.  Heart size is normal.     IMPRESSION: Bibasilar consolidation in the lung fields left side slightly larger again noted with small infiltrate left upper lobe and bullous changes the left and right lung apices. No cavitary lesion or loculated pleural effusion detected on the current examination.  Trace pericardial effusion measuring 7.1 mm appearing since previous examination.  Right  hemidiaphragm elevation.     Ct Chest W Cont    Result Date: 02/25/2017  CT CHEST W CONT dated 02/25/2017 12:19 PM INDICATION:  Pneumonia, Hematemesis COMPARISON: None TECHNIQUE: Spiral CT with IV contrast bolus through the thorax was performed. 2.5 mm axial images with sagittal and coronal reconstructions produced. Contrast material 100 mL Isovue 300. One or more of the following dose reduction techniques were used: automated exposure control, adjustment of the mA and/or kV according to patient size, use of iterative reconstruction technique. INTERPRETATION:  There is less than optimal opacification of the pulmonary arterial tree. No definite pulmonary emboli can be seen. The aorta is unremarkable. There are dense consolidative infiltrates in both lower lobes, and there is patchy infiltration in the left upper lobe. No definite pleural effusion is seen. Multiple air bronchograms are seen in the lower lobes. No hilar or mediastinal mass is identified. The tracheobronchial tree is unremarkable. The thyroid is unremarkable. No cervical lymphadenopathy is seen. No chest wall abnormality is observed. Limited images the upper abdomen show no significant abnormality. The visualized skeletal structures are unremarkable.     IMPRESSION: 1.  Dense bilateral lower lobe infiltrates consistent with pneumonia. There is also patchy infiltration in the left upper lobe. 2.  Suboptimal opacification of the pulmonary arterial tree. No gross pulmonary embolic disease is observed.  Xr Chest Port    Result Date: 02/24/2017  EXAM:  XR CHEST PORT dated 02/24/2017 8:31 AM INDICATION:  pre op. Nurses note states patient coughing up blood-tinged sputum. COMPARISON:  None. FINDINGS: A portable AP semierect radiograph of the chest was obtained at 0819 hours. There are bilateral lower lung zone infiltrates slightly more prominent on the left than the right. There is no obscuration of the  diaphragm or of the heart border, and localization of the infiltrates is difficult. Cannot rule out overlying pleural disease. The upper lung zones are clear. Heart size is normal. No hilar or mediastinal mass.     IMPRESSION:  Bilateral lower lung zone densities consistent with bilateral infiltrates. Consider conventional PA and lateral views for better characterization.     Assessment: Suspect abscess formation.    Plan:  Continue current meds    Monitor culture, fluid and electrolyte.    Follow CT scan result   `

## 2017-02-28 NOTE — Other (Signed)
Pt states he is feeling better. Isolation d/c per discussion with ID/PCP. Pt has been on [place] for 6 days.  Ct results are as posted.  Pt medicated for pain as per [facility] and is enc to use IS.

## 2017-02-28 NOTE — Progress Notes (Signed)
Assumed care at 1930. Alert/ oriented x3. Denies chest pain, shortness of breath at rest. Temp 102.5- percocet given. Continue IV fluids and antibiotics. Oral fluids encouraged and tolerated.  Ongoing monitoring.

## 2017-02-28 NOTE — Other (Signed)
Reason for Admission: Fever  Abdominal Pain  Influenza      Problem List:   Hospital Problems  Date Reviewed: 02/23/2017          Codes Class Noted POA    * (Principal)Influenza ICD-10-CM: J11.1  ICD-9-CM: 487.1  02/23/2017 Yes        CAP (community acquired pneumonia) ICD-10-CM: J18.9  ICD-9-CM: 486  02/23/2017 Yes              Background:    Past Medical History: No past medical history on file.      Pain Assessment:  Pain Intensity 1: 5 (02/28/17 1820)  Pain Location 1: Back   Pain Intervention(s) 1: Medication (see MAR)    Patient Stated Pain Goal: 0            Time of last Intervention: 4 hours  Effectiveness of intervention: some relief               Summary of Shift :   Bedside and Verbal shift change report given to Inocencio HomesGayle (Cabin crewoncoming nurse) by Revonda StandardAllison (offgoing nurse). Report included the following information SBAR, Kardex, Intake/Output, MAR, Accordion, Recent Results and Med Rec Status.

## 2017-02-28 NOTE — Progress Notes (Signed)
@  0930 s/w floor to inform the need for officers for transport to CT for exam / awaiting call that officers have arrived SMS

## 2017-03-01 MED ORDER — ONDANSETRON (PF) 4 MG/2 ML INJECTION
4 mg/2 mL | Freq: Four times a day (QID) | INTRAMUSCULAR | Status: DC | PRN
Start: 2017-03-01 — End: 2017-03-12
  Administered 2017-03-01 – 2017-03-04 (×6): via INTRAVENOUS

## 2017-03-01 MED ORDER — PANTOPRAZOLE 40 MG IV SOLR
40 mg | Freq: Every day | INTRAVENOUS | Status: DC
Start: 2017-03-01 — End: 2017-03-02
  Administered 2017-03-01 – 2017-03-02 (×2): via INTRAVENOUS

## 2017-03-01 MED FILL — IPRATROPIUM-ALBUTEROL 2.5 MG-0.5 MG/3 ML NEB SOLUTION: 2.5 mg-0.5 mg/3 ml | RESPIRATORY_TRACT | Qty: 3

## 2017-03-01 MED FILL — PIPERACILLIN-TAZOBACTAM 3.375 GRAM IV SOLR: 3.375 gram | INTRAVENOUS | Qty: 3.38

## 2017-03-01 MED FILL — DEXTROSE 5% IN NORMAL SALINE IV: INTRAVENOUS | Qty: 1000

## 2017-03-01 MED FILL — TAMIFLU 75 MG CAPSULE: 75 mg | ORAL | Qty: 1

## 2017-03-01 MED FILL — TYLENOL 325 MG TABLET: 325 mg | ORAL | Qty: 2

## 2017-03-01 MED FILL — OXYCODONE-ACETAMINOPHEN 5 MG-325 MG TAB: 5-325 mg | ORAL | Qty: 2

## 2017-03-01 MED FILL — ENOXAPARIN 40 MG/0.4 ML SUB-Q SYRINGE: 40 mg/0.4 mL | SUBCUTANEOUS | Qty: 0.4

## 2017-03-01 MED FILL — ONDANSETRON (PF) 4 MG/2 ML INJECTION: 4 mg/2 mL | INTRAMUSCULAR | Qty: 2

## 2017-03-01 MED FILL — PROTONIX 40 MG INTRAVENOUS SOLUTION: 40 mg | INTRAVENOUS | Qty: 40

## 2017-03-01 MED FILL — SENNA LAX 8.6 MG TABLET: 8.6 mg | ORAL | Qty: 1

## 2017-03-01 MED FILL — VANCOMYCIN 1,000 MG IV SOLR: 1000 mg | INTRAVENOUS | Qty: 1250

## 2017-03-01 MED FILL — VANCOMYCIN 10 GRAM IV SOLR: 10 gram | INTRAVENOUS | Qty: 1250

## 2017-03-01 MED FILL — ZOSYN 3.375 GRAM INTRAVENOUS SOLUTION: 3.375 gram | INTRAVENOUS | Qty: 3.38

## 2017-03-01 NOTE — Other (Signed)
Pt c/o feeling naueous today and was given Zofran as per order. He asked for Phenergan repeatedly throughout the day. He was also given Protonix as per order.  His sputum is noted to yellow as opposed to brown and tan of yesterday.  He also was a febrile, but is noted to have periods of sweating even though afebrile. He is encouraged to remove blankets at and extra towels at that time.  He was seen as bs by his PCP.

## 2017-03-01 NOTE — Progress Notes (Signed)
Assumed care at 1930. Alert/ oriented x 3. Denies chest pain; shortness of breath. Oxygen sat = 98 % on room air. Complained of nausea and vomiting; small amount clear emesis in basin.   Remains afebrile. Ongoing monitoring.

## 2017-03-01 NOTE — Progress Notes (Signed)
Patient Active Problem List   Diagnosis Code   ??? CAP (community acquired pneumonia) J18.9     Late note     Subjective: Still complains of LLL pain    Request to be on ATIVAN stating that he is anxious and nobody witnessed it.    He also stated that he prefers pain meds in IV form.    CT scan result explained    ECHO done    Physical Examination:  Temp (24hrs), Avg:98.7 ??F (37.1 ??C), Min:98.3 ??F (36.8 ??C), Max:99.1 ??F (37.3 ??C)      Visit Vitals   ??? BP (!) 150/104 (BP 1 Location: Left arm, BP Patient Position: At rest)   ??? Pulse 84   ??? Temp 98.7 ??F (37.1 ??C)   ??? Resp 18   ??? Ht 6\' 3"  (1.905 m)   ??? Wt 72.6 kg (160 lb)   ??? SpO2 96%   ??? BMI 20 kg/m2       GENERAL:  In no acute distress, comfortable, lying in bed.  SKIN:   Warm, moist.  Normal skin turgor.  No rash or scratch mark noted.   HEENT:   Normocephalic, Atraumatic,     Pink conjunctiva, Anicteric, EOMI,     No sinus tenderness or stuffy nose and     Clear & moist oral mucosa.  NECK: Supple, no JVD, no thyroidmegammy or lymphadenopathy.  AXILLA: No Lymphadenopathy.  LUNGS:   Symmetrical, Resonant and BL LL crackles.   CARDIAC:  S1 & S2, RRR. No murmurs, rubs or gallops heard  ABDOMEN:   Moves with respiration. Soft, NT, BS +.   INGUINAL: No hernia or lymphadenopathy.   GUS:  No supra pubic area tenderness. No CVAT.  EXTREM.:   No edema.   NEURO:   AOX3, No focal deficits..        Labs Reviewed:  No results for input(s): WBC, HGB, HGBEXT, HCT, HCTEXT, PLT, PLTEXT, HGBEXT, HCTEXT, PLTEXT in the last 72 hours.  No results for input(s): NA, K, CL, CO2, GLU, BUN, CREA, CA, MG, PHOS, ALB, TBIL, SGOT, ALT in the last 72 hours.  No components found for: GLPOC  No results for input(s): PH, PCO2, PO2, HCO3, FIO2 in the last 72 hours.  No results for input(s): INR in the last 72 hours.    No lab exists for component: INREXT, INREXT    Culture  All Micro Results     Procedure Component Value Units Date/Time    CULTURE, BLOOD [[id number]] Collected:  02/27/17 1830     Order Status:  Completed Specimen:  Blood from Blood Updated:  03/02/17 1508     Special Requests: NO SPECIAL REQUESTS        Culture result: NO GROWTH 3 DAYS       CULTURE, BLOOD [[id number]] Collected:  02/24/17 0430    Order Status:  Completed Specimen:  Whole Blood from Blood Updated:  03/02/17 1507     Special Requests: NO SPECIAL REQUESTS        Culture result: NO GROWTH 6 DAYS       CULTURE, BLOOD [[id number]]  (Abnormal)  (Susceptibility) Collected:  02/25/17 0900    Order Status:  Completed Specimen:  Whole Blood from Blood Updated:  02/28/17 1152     Special Requests: NO SPECIAL REQUESTS        GRAM STAIN         GRAM POS COCCI IN CLUSTERS AEROBIC BOTTLE CALLED RESULTS TO/VERIFIED READ BACK WITH NURSE TODD  GREGORY AT 1255 ON 02/26/17 EXT.[id number]     Culture result:         **METHICILLIN RESISTANT STAPHYLOCOCCUS AUREUS** AEROBIC BOTTLE (A)              REMAINING BOTTLE(S) HAS/HAVE NO GROWTH SO FAR    CULTURE, RESPIRATORY/SPUTUM/BRONCH Gay Filler STAIN [[id number]]  (Abnormal)  (Susceptibility) Collected:  02/25/17 1930    Order Status:  Completed Specimen:  Sputum from Sputum Updated:  02/27/17 1232     Special Requests: NO SPECIAL REQUESTS        GRAM STAIN         MANY POLYMORPHONUCLEAR LEUKOCYTES (PMNs)              MODERATE GRAM POS COCCI IN CLUSTERS     Culture result:         HEAVY **METHICILLIN RESISTANT STAPHYLOCOCCUS AUREUS** PATIENT IS A KNOWN MRSA (A)              MODERATE ALPHA STREPTOCOCCUS, NOT S. PNEUMONIAE (A)    CULTURE, URINE [[id number]] Collected:  02/24/17 0630    Order Status:  Completed Specimen:  Urine from Foley Specimen Updated:  02/26/17 1237     Special Requests: NO SPECIAL REQUESTS        Culture result: NO GROWTH 48 HOURS             Urine  Color   Date Value Ref Range Status   02/24/2017 YELLOW   Final     Appearance   Date Value Ref Range Status   02/24/2017 CLEAR   Final     Specific gravity   Date Value Ref Range Status   02/24/2017 1.028   Final     pH (UA)    Date Value Ref Range Status   02/24/2017 5.5 5.0 - 7.0   Final     Protein   Date Value Ref Range Status   02/24/2017 100 (A) NEG mg/dL Final     Ketone   Date Value Ref Range Status   02/24/2017 TRACE (A) NEG mg/dL Final     Bilirubin   Date Value Ref Range Status   02/24/2017 NEGATIVE  NEG   Final     Blood   Date Value Ref Range Status   02/24/2017 MODERATE (A) NEG   Final     Urobilinogen   Date Value Ref Range Status   02/24/2017 1.0 0.2 - 1.0 EU/dL Final     Nitrites   Date Value Ref Range Status   02/24/2017 NEGATIVE  NEG   Final     Leukocyte Esterase   Date Value Ref Range Status   02/24/2017 NEGATIVE  NEG   Final       Radiology Studies:  Xr Chest Pa Lat    Result Date: 02/26/2017  EXAM:  XR CHEST PA LAT dated 02/26/2017 10:31 AM INDICATION:   Persistent fever suspect empyema or abscess. COMPARISON: Chest AP semierect film from 02/24/2017.Chest CT scan 02/25/17. PA and lateral radiographs of the chest.  Dense infiltrate or consolidation in the posterior right midlung adjacent to the fissure in the superior segment right lower lobe measuring approximately 8.6 x 7.3 x 7.3 cm has  increased since previous examination 02/24/17.  Infiltrate left lower lobe has increased.  Moderate consolidation left lower lobe again noted Lungs are normally inflated.  Normal heart size..  Right hilar prominence compatible with small nodes unchanged. Marland Kitchen     IMPRESSION: Increasing consolidation posterior right midlung and increasing infiltrate left  lower lobe since previous examination 02/24/17.  Findings are unchanged since CT scan of 02/25/17. Dense consolidation left lower lobe measuring 4.9 cm in diameter, focal consolidation vs. Beginning of an abscess. Close follow-up examination suggested.  Some distention of colon with air unchanged.     Xr Spine Lumb Min 4 V    Result Date: 02/25/2017  EXAM:  XR SPINE LUMB MIN 4 V dated 02/25/2017 12:28 PM INDICATION:   LBP COMPARISON:  None. FINDINGS:  AP, lateral, and oblique views of the lumbar  spine were obtained. No fracture or subluxation. No disc space narrowing. No arthritic changes. No osteolytic or osteoblastic lesion. SI joints are unremarkable. There is opacification of both renal collecting system and ureters from the intravenous contrast injection of the prior CT.     IMPRESSION:   Normal exam.     Ct Chest Wo Cont    Result Date: 02/28/2017  EXAM:  CT CHEST WO CONT  dated 02/28/2017 2:38 PM INDICATION:  MRSA pneumonia worsening suspect abscess formation COMPARISON: CT scan the chest with contrast injection 02/25/17.  CONTRAST:  None. TECHNIQUE: Spiral CT through the thorax without contrast was performed. 2.5 mm axial images with sagittal and coronal reconstructions produced. CT dose reduction was achieved through use of a standardized protocol tailored for this examination and automatic exposure control for dose modulation. The absence of intravenous contrast prevents evaluation for pulmonary emboli or aortic dissection. FINDINGS:Small bulla in the left and right upper lobes.  Focal infiltrate in the anterior part of the left upper lobe unchanged since the previous examination. Bilateral basilar consolidation again noted.  On the right side it measures approximately 10 cm in craniocaudal dimension unchanged since the previous examination.  On the left side it measures approximately 10 cm , increasing from approximately 9 cm on the previous study.  No significant pleural effusion or empyema detected at this time.  No cavitary lesions other than small bulla within the lung fields.\\ 7.1 mm anterior inferior pericardial effusion again noted.  Right hemidiaphragm elevation.  Heart size is normal.     IMPRESSION: Bibasilar consolidation in the lung fields left side slightly larger again noted with small infiltrate left upper lobe and bullous changes the left and right lung apices. No cavitary lesion or loculated pleural effusion detected on the current examination.  Trace pericardial  effusion measuring 7.1 mm appearing since previous examination.  Right hemidiaphragm elevation.     Ct Chest W Cont    Result Date: 02/25/2017  CT CHEST W CONT dated 02/25/2017 12:19 PM INDICATION:  Pneumonia, Hematemesis COMPARISON: None TECHNIQUE: Spiral CT with IV contrast bolus through the thorax was performed. 2.5 mm axial images with sagittal and coronal reconstructions produced. Contrast material 100 mL Isovue 300. One or more of the following dose reduction techniques were used: automated exposure control, adjustment of the mA and/or kV according to patient size, use of iterative reconstruction technique. INTERPRETATION:  There is less than optimal opacification of the pulmonary arterial tree. No definite pulmonary emboli can be seen. The aorta is unremarkable. There are dense consolidative infiltrates in both lower lobes, and there is patchy infiltration in the left upper lobe. No definite pleural effusion is seen. Multiple air bronchograms are seen in the lower lobes. No hilar or mediastinal mass is identified. The tracheobronchial tree is unremarkable. The thyroid is unremarkable. No cervical lymphadenopathy is seen. No chest wall abnormality is observed. Limited images the upper abdomen show no significant abnormality. The visualized skeletal structures are unremarkable.  IMPRESSION: 1.  Dense bilateral lower lobe infiltrates consistent with pneumonia. There is also patchy infiltration in the left upper lobe. 2.  Suboptimal opacification of the pulmonary arterial tree. No gross pulmonary embolic disease is observed.    Xr Chest Port    Result Date: 02/24/2017  EXAM:  XR CHEST PORT dated 02/24/2017 8:31 AM INDICATION:  pre op. Nurses note states patient coughing up blood-tinged sputum. COMPARISON:  None. FINDINGS: A portable AP semierect radiograph of the chest was obtained at 0819 hours. There are bilateral lower lung zone infiltrates slightly more  prominent on the left than the right. There is no obscuration of the diaphragm or of the heart border, and localization of the infiltrates is difficult. Cannot rule out overlying pleural disease. The upper lung zones are clear. Heart size is normal. No hilar or mediastinal mass.     IMPRESSION:  Bilateral lower lung zone densities consistent with bilateral infiltrates. Consider conventional PA and lateral views for better characterization.     Assessment: Same    Plan:  Continue current meds    Monitor culture, fluid and electrolyte.    Follow ECHO result   `

## 2017-03-01 NOTE — Progress Notes (Signed)
Consult for Vancomycin Dosing by Pharmacy by Dr. Dwyane DeeAtnafu  Indication:  Post-Viral CAP/MRSA suspected  Target Level: 15-20  Day of Therapy: 6  Previous Regimen  Vanco 1250 mg IV Q 12 hrs   Last Level  pending   Other Current Antibiotics  Zosyn 3.375 GM Iv q 8 Hrs   Significant Cultures  MRSA+ve (blood cul on 2/27); MRSA+ve, Alpha strep, not S. Pneumoniae(Sputum cul on 2/27)   Serum Creatinine Lab Results   Component Value Date/Time    Creatinine 1.08 02/27/2017 06:00 AM       Creatinine Clearance Estimated Creatinine Clearance: 94.3 mL/min (based on Cr of 1.08).   BUN Lab Results   Component Value Date/Time    BUN 10 02/27/2017 06:00 AM       WBC Lab Results   Component Value Date/Time    WBC 8.1 02/27/2017 06:00 AM       H/H Lab Results   Component Value Date/Time    HGB 12.2 (L) 02/27/2017 06:00 AM      Platelets Lab Results   Component Value Date/Time    PLATELET 152 02/27/2017 06:00 AM      Temp Temp: (!) 101.4 ??F (38.6 ??C)       Dose administration notes:   Doses given appropriately as scheduled  Plan for level:  pending  Adjustments:  None  Plan:  Continue to monitor

## 2017-03-01 NOTE — Progress Notes (Signed)
SpO2 is 92% on room air.  Pt was placed on 2LNC post 8pm neb tx; nasal cannula was under the pt's pillow when RT returned for the 2 am neb tx.  Pt's breath sounds are diminished in the lower lobes.  Tan bloody sputum is noted in pt's basin. No SOB or respiratory distress noted at this time. Neb tx given as ordered,

## 2017-03-01 NOTE — Progress Notes (Signed)
(  late entry) Dr. Dwyane DeeAtnafu made aware of elevated temp, NEWS score and updated on patient status. No new orders; continue plan of care.

## 2017-03-01 NOTE — Progress Notes (Signed)
Denies shortness of breath; oxygen sat= 93 % on room air. Pt removes oxygen. Productive coughing with thick tan sputum. Temp elevated during night. Received tylenol, increased oral fluids. Ongoing monitoring.

## 2017-03-01 NOTE — Other (Signed)
Bedside and Verbal shift change report given to Allison RN (oncoming nurse) by Gail P Cook, RN   (offgoing nurse). Report included the following information SBAR, Kardex, Intake/Output, MAR and Recent Results.

## 2017-03-01 NOTE — Progress Notes (Signed)
Problem: Pain - Acute  Goal: *Pain is controlled to three or less  Outcome: Progressing Towards Goal  Complained of lower back pain. Medicated with percocet PRN with intermittent reliefl of pain.

## 2017-03-01 NOTE — Other (Signed)
Reason for Admission: Fever  Abdominal Pain  Influenza      Problem List:   Hospital Problems  Date Reviewed: 02/23/2017          Codes Class Noted POA    * (Principal)Influenza ICD-10-CM: J11.1  ICD-9-CM: 487.1  02/23/2017 Yes        CAP (community acquired pneumonia) ICD-10-CM: J18.9  ICD-9-CM: 486  02/23/2017 Yes              Background:    Past Medical History: No past medical history on file.            Pain Assessment:  Pain Intensity 1: 7 (03/01/17 1838)  Pain Location 1: Back   Pain Intervention(s) 1: Medication (see MAR)    Patient Stated Pain Goal: 0            Time of last Intervention: 2 hours  Effectiveness of intervention: some relief               Summary of Shift :   Bedside and Verbal shift change report given to gail (oncoming nurse) by Revonda StandardAllison (offgoing nurse). Report included the following information SBAR, Kardex, Intake/Output, MAR, Accordion and Recent Results.

## 2017-03-02 LAB — CULTURE, BLOOD: Culture result:: NO GROWTH

## 2017-03-02 MED ORDER — SODIUM CHLORIDE 0.9 % INJECTION
202 mg/2 mL | Freq: Two times a day (BID) | INTRAMUSCULAR | Status: DC
Start: 2017-03-02 — End: 2017-03-02

## 2017-03-02 MED ORDER — PHARMACY VANCOMYCIN NOTE
Freq: Once | Status: AC
Start: 2017-03-02 — End: 2017-03-02
  Administered 2017-03-03: 01:00:00

## 2017-03-02 MED ORDER — CLONIDINE 0.1 MG TAB
0.1 mg | Freq: Three times a day (TID) | ORAL | Status: DC
Start: 2017-03-02 — End: 2017-03-12
  Administered 2017-03-03 – 2017-03-12 (×21): via ORAL

## 2017-03-02 MED ORDER — PANTOPRAZOLE 40 MG TAB, DELAYED RELEASE
40 mg | Freq: Every day | ORAL | Status: DC
Start: 2017-03-02 — End: 2017-03-03
  Administered 2017-03-03 (×2): via ORAL

## 2017-03-02 MED ORDER — OXYCODONE 10 MG TAB
10 mg | ORAL | Status: DC | PRN
Start: 2017-03-02 — End: 2017-03-12
  Administered 2017-03-03 – 2017-03-12 (×27): via ORAL

## 2017-03-02 MED FILL — TAMIFLU 75 MG CAPSULE: 75 mg | ORAL | Qty: 1

## 2017-03-02 MED FILL — SENNA LAX 8.6 MG TABLET: 8.6 mg | ORAL | Qty: 1

## 2017-03-02 MED FILL — IPRATROPIUM-ALBUTEROL 2.5 MG-0.5 MG/3 ML NEB SOLUTION: 2.5 mg-0.5 mg/3 ml | RESPIRATORY_TRACT | Qty: 3

## 2017-03-02 MED FILL — DEXTROSE 5% IN NORMAL SALINE IV: INTRAVENOUS | Qty: 1000

## 2017-03-02 MED FILL — VANCOMYCIN 1,000 MG IV SOLR: 1000 mg | INTRAVENOUS | Qty: 1250

## 2017-03-02 MED FILL — ENOXAPARIN 40 MG/0.4 ML SUB-Q SYRINGE: 40 mg/0.4 mL | SUBCUTANEOUS | Qty: 0.4

## 2017-03-02 MED FILL — PIPERACILLIN-TAZOBACTAM 3.375 GRAM IV SOLR: 3.375 gram | INTRAVENOUS | Qty: 3.38

## 2017-03-02 MED FILL — PROTONIX 40 MG INTRAVENOUS SOLUTION: 40 mg | INTRAVENOUS | Qty: 40

## 2017-03-02 MED FILL — OXYCODONE-ACETAMINOPHEN 5 MG-325 MG TAB: 5-325 mg | ORAL | Qty: 2

## 2017-03-02 MED FILL — ONDANSETRON (PF) 4 MG/2 ML INJECTION: 4 mg/2 mL | INTRAMUSCULAR | Qty: 2

## 2017-03-02 MED FILL — PHARMACY VANCOMYCIN NOTE: Qty: 1

## 2017-03-02 NOTE — Progress Notes (Signed)
Resting at this time with no oxygen in use. No report of SOB. Patient does report pain. Not time for meds. Afebrile. Will monitor.

## 2017-03-02 NOTE — Other (Signed)
Bedside and Verbal shift change report given to Todd RN (oncoming nurse) by Gail P Cook, RN   (offgoing nurse). Report included the following information SBAR, Kardex, Intake/Output, MAR and Recent Results.

## 2017-03-02 NOTE — Progress Notes (Signed)
Received zofran PRN for vomiting; no further vomiting. Remained afebrile at HS.  Productive cough with tan sputum noted; sat = 97-98% on room air. Received percocet for pain. BP elevated- will endorse to attending for management.

## 2017-03-02 NOTE — Progress Notes (Signed)
SpO2 is 98%- 99% on room air. Lower lobes are diminished.  No SOB or respiratory distress noted at this time.  Neb tx given as ordered.

## 2017-03-02 NOTE — Progress Notes (Signed)
Bedside, Verbal and Written shift change report given to anisse (oncoming nurse) by todd (offgoing nurse). Report included the following information SBAR, Kardex, MAR and Recent Results.

## 2017-03-02 NOTE — Progress Notes (Signed)
Awake and alert. No report of pain or SOB. Patient reports " I dont feel good ".

## 2017-03-02 NOTE — Progress Notes (Addendum)
Patient Active Problem List   Diagnosis Code   ??? CAP (community acquired pneumonia) J18.9   ??? HTN (hypertension) I10       Subjective: Complains of Left sided CP when taking deep breath or moving around.    Fever and cough decreased    Cough is productive and has brownish phlegm. No gross hemoptysis anymore    Refused to put Oxygen on and saturates in higher 90's    Repeat blood culture negative    ECHO report pending    BP continue to be high    Physical Examination:  Temp (24hrs), Avg:98.7 ??F (37.1 ??C), Min:98.3 ??F (36.8 ??C), Max:99.1 ??F (37.3 ??C)      Visit Vitals   ??? BP (!) 150/104 (BP 1 Location: Left arm, BP Patient Position: At rest)   ??? Pulse 84   ??? Temp 98.7 ??F (37.1 ??C)   ??? Resp 18   ??? Ht 6\' 3"  (1.905 m)   ??? Wt 72.6 kg (160 lb)   ??? SpO2 96%   ??? BMI 20 kg/m2     Not examined stating that he is in pain      Labs Reviewed:  No results for input(s): WBC, HGB, HGBEXT, HCT, HCTEXT, PLT, PLTEXT, HGBEXT, HCTEXT, PLTEXT, HGBEXT, HCTEXT, PLTEXT in the last 72 hours.  No results for input(s): NA, K, CL, CO2, GLU, BUN, CREA, CA, MG, PHOS, ALB, TBIL, SGOT, ALT in the last 72 hours.  No components found for: GLPOC  No results for input(s): PH, PCO2, PO2, HCO3, FIO2 in the last 72 hours.  No results for input(s): INR in the last 72 hours.    No lab exists for component: Rennie Natter, INREXT    Culture  All Micro Results     Procedure Component Value Units Date/Time    CULTURE, BLOOD [[id number]] Collected:  02/27/17 1830    Order Status:  Completed Specimen:  Blood from Blood Updated:  03/02/17 1508     Special Requests: NO SPECIAL REQUESTS        Culture result: NO GROWTH 3 DAYS       CULTURE, BLOOD [[id number]] Collected:  02/24/17 0430    Order Status:  Completed Specimen:  Whole Blood from Blood Updated:  03/02/17 1507     Special Requests: NO SPECIAL REQUESTS        Culture result: NO GROWTH 6 DAYS       CULTURE, BLOOD [[id number]]  (Abnormal)  (Susceptibility) Collected:  02/25/17 0900     Order Status:  Completed Specimen:  Whole Blood from Blood Updated:  02/28/17 1152     Special Requests: NO SPECIAL REQUESTS        GRAM STAIN         GRAM POS COCCI IN CLUSTERS AEROBIC BOTTLE CALLED RESULTS TO/VERIFIED READ BACK WITH NURSE TODD GREGORY AT 1255 ON 02/26/17 [id number]     Culture result:         **METHICILLIN RESISTANT STAPHYLOCOCCUS AUREUS** AEROBIC BOTTLE (A)              REMAINING BOTTLE(S) HAS/HAVE NO GROWTH SO FAR    CULTURE, RESPIRATORY/SPUTUM/BRONCH Gay Filler STAIN [[id number]]  (Abnormal)  (Susceptibility) Collected:  02/25/17 1930    Order Status:  Completed Specimen:  Sputum from Sputum Updated:  02/27/17 1232     Special Requests: NO SPECIAL REQUESTS        GRAM STAIN         MANY POLYMORPHONUCLEAR LEUKOCYTES (PMNs)  MODERATE GRAM POS COCCI IN CLUSTERS     Culture result:         HEAVY **METHICILLIN RESISTANT STAPHYLOCOCCUS AUREUS** PATIENT IS A KNOWN MRSA (A)              MODERATE ALPHA STREPTOCOCCUS, NOT S. PNEUMONIAE (A)    CULTURE, URINE [[id number]] Collected:  02/24/17 0630    Order Status:  Completed Specimen:  Urine from Foley Specimen Updated:  02/26/17 1237     Special Requests: NO SPECIAL REQUESTS        Culture result: NO GROWTH 48 HOURS             Urine  Color   Date Value Ref Range Status   02/24/2017 YELLOW   Final     Appearance   Date Value Ref Range Status   02/24/2017 CLEAR   Final     Specific gravity   Date Value Ref Range Status   02/24/2017 1.028   Final     pH (UA)   Date Value Ref Range Status   02/24/2017 5.5 5.0 - 7.0   Final     Protein   Date Value Ref Range Status   02/24/2017 100 (A) NEG mg/dL Final     Ketone   Date Value Ref Range Status   02/24/2017 TRACE (A) NEG mg/dL Final     Bilirubin   Date Value Ref Range Status   02/24/2017 NEGATIVE  NEG   Final     Blood   Date Value Ref Range Status   02/24/2017 MODERATE (A) NEG   Final     Urobilinogen   Date Value Ref Range Status   02/24/2017 1.0 0.2 - 1.0 EU/dL Final     Nitrites    Date Value Ref Range Status   02/24/2017 NEGATIVE  NEG   Final     Leukocyte Esterase   Date Value Ref Range Status   02/24/2017 NEGATIVE  NEG   Final       Radiology Studies:  Xr Chest Pa Lat    Result Date: 02/26/2017  EXAM:  XR CHEST PA LAT dated 02/26/2017 10:31 AM INDICATION:   Persistent fever suspect empyema or abscess. COMPARISON: Chest AP semierect film from 02/24/2017.Chest CT scan 02/25/17. PA and lateral radiographs of the chest.  Dense infiltrate or consolidation in the posterior right midlung adjacent to the fissure in the superior segment right lower lobe measuring approximately 8.6 x 7.3 x 7.3 cm has  increased since previous examination 02/24/17.  Infiltrate left lower lobe has increased.  Moderate consolidation left lower lobe again noted Lungs are normally inflated.  Normal heart size..  Right hilar prominence compatible with small nodes unchanged. Marland Kitchen     IMPRESSION: Increasing consolidation posterior right midlung and increasing infiltrate left lower lobe since previous examination 02/24/17.  Findings are unchanged since CT scan of 02/25/17. Dense consolidation left lower lobe measuring 4.9 cm in diameter, focal consolidation vs. Beginning of an abscess. Close follow-up examination suggested.  Some distention of colon with air unchanged.     Xr Spine Lumb Min 4 V    Result Date: 02/25/2017  EXAM:  XR SPINE LUMB MIN 4 V dated 02/25/2017 12:28 PM INDICATION:   LBP COMPARISON:  None. FINDINGS:  AP, lateral, and oblique views of the lumbar spine were obtained. No fracture or subluxation. No disc space narrowing. No arthritic changes. No osteolytic or osteoblastic lesion. SI joints are unremarkable. There is opacification of both renal collecting system and ureters  from the intravenous contrast injection of the prior CT.     IMPRESSION:   Normal exam.     Ct Chest Wo Cont    Result Date: 02/28/2017  EXAM:  CT CHEST WO CONT  dated 02/28/2017 2:38 PM INDICATION:  MRSA  pneumonia worsening suspect abscess formation COMPARISON: CT scan the chest with contrast injection 02/25/17.  CONTRAST:  None. TECHNIQUE: Spiral CT through the thorax without contrast was performed. 2.5 mm axial images with sagittal and coronal reconstructions produced. CT dose reduction was achieved through use of a standardized protocol tailored for this examination and automatic exposure control for dose modulation. The absence of intravenous contrast prevents evaluation for pulmonary emboli or aortic dissection. FINDINGS:Small bulla in the left and right upper lobes.  Focal infiltrate in the anterior part of the left upper lobe unchanged since the previous examination. Bilateral basilar consolidation again noted.  On the right side it measures approximately 10 cm in craniocaudal dimension unchanged since the previous examination.  On the left side it measures approximately 10 cm , increasing from approximately 9 cm on the previous study.  No significant pleural effusion or empyema detected at this time.  No cavitary lesions other than small bulla within the lung fields.\\ 7.1 mm anterior inferior pericardial effusion again noted.  Right hemidiaphragm elevation.  Heart size is normal.     IMPRESSION: Bibasilar consolidation in the lung fields left side slightly larger again noted with small infiltrate left upper lobe and bullous changes the left and right lung apices. No cavitary lesion or loculated pleural effusion detected on the current examination.  Trace pericardial effusion measuring 7.1 mm appearing since previous examination.  Right hemidiaphragm elevation.     Ct Chest W Cont    Result Date: 02/25/2017  CT CHEST W CONT dated 02/25/2017 12:19 PM INDICATION:  Pneumonia, Hematemesis COMPARISON: None TECHNIQUE: Spiral CT with IV contrast bolus through the thorax was performed. 2.5 mm axial images with sagittal and coronal reconstructions produced. Contrast material 100 mL Isovue 300. One  or more of the following dose reduction techniques were used: automated exposure control, adjustment of the mA and/or kV according to patient size, use of iterative reconstruction technique. INTERPRETATION:  There is less than optimal opacification of the pulmonary arterial tree. No definite pulmonary emboli can be seen. The aorta is unremarkable. There are dense consolidative infiltrates in both lower lobes, and there is patchy infiltration in the left upper lobe. No definite pleural effusion is seen. Multiple air bronchograms are seen in the lower lobes. No hilar or mediastinal mass is identified. The tracheobronchial tree is unremarkable. The thyroid is unremarkable. No cervical lymphadenopathy is seen. No chest wall abnormality is observed. Limited images the upper abdomen show no significant abnormality. The visualized skeletal structures are unremarkable.     IMPRESSION: 1.  Dense bilateral lower lobe infiltrates consistent with pneumonia. There is also patchy infiltration in the left upper lobe. 2.  Suboptimal opacification of the pulmonary arterial tree. No gross pulmonary embolic disease is observed.    Xr Chest Port    Result Date: 02/24/2017  EXAM:  XR CHEST PORT dated 02/24/2017 8:31 AM INDICATION:  pre op. Nurses note states patient coughing up blood-tinged sputum. COMPARISON:  None. FINDINGS: A portable AP semierect radiograph of the chest was obtained at 0819 hours. There are bilateral lower lung zone infiltrates slightly more prominent on the left than the right. There is no obscuration of the diaphragm or of the heart border, and localization of the  infiltrates is difficult. Cannot rule out overlying pleural disease. The upper lung zones are clear. Heart size is normal. No hilar or mediastinal mass.     IMPRESSION:  Bilateral lower lung zone densities consistent with bilateral infiltrates. Consider conventional PA and lateral views for better characterization.     Assessment: HTN     Today's lab was noted.    Plan:  Continue current meds    Monitor culture, fluid and electrolyte.    Follow ECHO report    Repeat PA and lateral chest x-ray    Add low dose of Clonidine    Increase dose of Oxycodone.   `

## 2017-03-02 NOTE — Progress Notes (Signed)
Consult for Vancomycin Dosing by Pharmacy by Dr. Dwyane DeeAtnafu  Indication:  Post-Viral CAP/MRSA suspected  Target Level: 15-20  Day of Therapy: 6  Previous Regimen  Vanco 1250 mg IV Q 12 hrs   Last Level  8.4 on 02/25/17 (Trough Level   Other Current Antibiotics  Zosyn 3.375 GM Iv q 8 Hrs   Significant Cultures  MRSA+ve (blood cul on 2/27); MRSA+ve, Alpha strep, not S. Pneumoniae(Sputum cul on 2/27)   Serum Creatinine Lab Results   Component Value Date/Time    Creatinine 1.08 02/27/2017 06:00 AM       Creatinine Clearance Estimated Creatinine Clearance: 94.3 mL/min (based on Cr of 1.08).   BUN Lab Results   Component Value Date/Time    BUN 10 02/27/2017 06:00 AM       WBC Lab Results   Component Value Date/Time    WBC 8.1 02/27/2017 06:00 AM       H/H Lab Results   Component Value Date/Time    HGB 12.2 (L) 02/27/2017 06:00 AM      Platelets Lab Results   Component Value Date/Time    PLATELET 152 02/27/2017 06:00 AM      Temp Temp: (!) 101.4 ??F (38.6 ??C)       Dose administration notes:   Doses given appropriately as scheduled  Plan for level:  Draw Trough Level 30 minutes before next dose @ 20;30 on 03/02/17  Adjustments:  None  Plan:  Continue to monitor

## 2017-03-02 NOTE — Progress Notes (Signed)
Resting at this time with IVABT and IVF. Will monitor. Refuses nasal cannula at this time. " I dont need it ".

## 2017-03-02 NOTE — Progress Notes (Signed)
Awake, alert and oriented x 4. No report of SOB on room air. No cough today. Afebrile today. IVF and IVABT infusing. W#ill monitor.

## 2017-03-02 NOTE — Progress Notes (Signed)
Resting at this time. Seen by Dr. Dwyane DeeAtnafu. No SOB. Refuses oxygen today. IVF at this time. Will monitor.

## 2017-03-02 NOTE — Progress Notes (Signed)
Resting. Not able to eat at this time. IVABT and IVF through right IVHL. Will monitor.

## 2017-03-03 ENCOUNTER — Inpatient Hospital Stay: Admit: 2017-03-03 | Payer: MEDICAID | Primary: Family Medicine

## 2017-03-03 LAB — CBC WITH AUTOMATED DIFF
ABS. BASOPHILS: 0.1 10*3/uL (ref 0.0–0.2)
ABS. EOSINOPHILS: 0 10*3/uL (ref 0.0–0.7)
ABS. LYMPHOCYTES: 1.2 10*3/uL (ref 1.2–3.4)
ABS. MONOCYTES: 1.3 10*3/uL (ref 1.1–3.2)
ABS. NEUTROPHILS: 17.6 10*3/uL — ABNORMAL HIGH (ref 1.4–6.5)
BASOPHILS: 0 % (ref 0–2)
EOSINOPHILS: 0 % (ref 0–5)
HCT: 31.4 % — ABNORMAL LOW (ref 36.8–45.2)
HGB: 11 g/dL — ABNORMAL LOW (ref 12.8–15.0)
IMMATURE GRANULOCYTES: 1 % (ref 0.0–5.0)
LYMPHOCYTES: 6 % — ABNORMAL LOW (ref 16–40)
MCH: 28.6 PG (ref 27–31)
MCHC: 35 g/dL (ref 32–36)
MCV: 81.8 FL (ref 81–99)
MONOCYTES: 7 % (ref 0–12)
MPV: 9.6 FL (ref 7.4–10.4)
NEUTROPHILS: 87 % — ABNORMAL HIGH (ref 40–70)
NRBC: 0 PER 100 WBC
PLATELET: 391 10*3/uL (ref 140–450)
RBC: 3.84 M/uL — ABNORMAL LOW (ref 4.0–5.2)
RDW: 13.2 % (ref 11.5–14.5)
WBC: 20.4 10*3/uL — ABNORMAL HIGH (ref 4.8–10.8)

## 2017-03-03 LAB — METABOLIC PANEL, COMPREHENSIVE
A-G Ratio: 0.5 — ABNORMAL LOW (ref 1.0–3.1)
ALT (SGPT): 52 U/L (ref 12.0–78.0)
AST (SGOT): 65 U/L — ABNORMAL HIGH (ref 15–37)
Albumin: 1.6 g/dL — ABNORMAL LOW (ref 3.40–5.00)
Alk. phosphatase: 170 U/L — ABNORMAL HIGH (ref 46–116)
Anion gap: 14 mmol/L (ref 10–17)
BUN/Creatinine ratio: 8 (ref 6.0–20.0)
BUN: 7 MG/DL (ref 7–18)
Bilirubin, total: 2.1 MG/DL — ABNORMAL HIGH (ref 0.20–1.00)
CO2: 26 mmol/L (ref 21–32)
Calcium: 6.9 MG/DL — CL (ref 8.5–10.1)
Chloride: 97 mmol/L — ABNORMAL LOW (ref 98–107)
Creatinine: 0.86 MG/DL (ref 0.6–1.3)
GFR est AA: >60 mL/min/{1.73_m2} (ref >60)
GFR est non-AA: >60 mL/min/{1.73_m2} (ref >60)
Globulin: 3.5 g/dL
Glucose: 93 mg/dL (ref 74–106)
Potassium: 3.7 mmol/L (ref 3.50–5.10)
Protein, total: 5.1 g/dL — ABNORMAL LOW (ref 6.40–8.20)
Sodium: 133 mmol/L — ABNORMAL LOW (ref 136–145)

## 2017-03-03 LAB — MAGNESIUM: Magnesium: 1.5 mg/dL — ABNORMAL LOW (ref 1.8–2.4)

## 2017-03-03 LAB — LACTIC ACID: Lactic acid: 1.2 MMOL/L (ref 0.4–2.0)

## 2017-03-03 LAB — VANCOMYCIN, TROUGH: Vancomycin,trough: 11.7 ug/mL — ABNORMAL LOW (ref 15–20)

## 2017-03-03 MED ORDER — PHARMACY VANCOMYCIN NOTE
Freq: Once | Status: AC
Start: 2017-03-03 — End: 2017-03-04
  Administered 2017-03-04: 21:00:00

## 2017-03-03 MED ORDER — VANCOMYCIN 10 GRAM IV SOLR
10 gram | Freq: Three times a day (TID) | INTRAVENOUS | Status: DC
Start: 2017-03-03 — End: 2017-03-04
  Administered 2017-03-03 – 2017-03-04 (×4): via INTRAVENOUS

## 2017-03-03 MED ORDER — FAMOTIDINE 20 MG TAB
20 mg | Freq: Two times a day (BID) | ORAL | Status: DC
Start: 2017-03-03 — End: 2017-03-12
  Administered 2017-03-04 – 2017-03-12 (×17): via ORAL

## 2017-03-03 MED FILL — IPRATROPIUM-ALBUTEROL 2.5 MG-0.5 MG/3 ML NEB SOLUTION: 2.5 mg-0.5 mg/3 ml | RESPIRATORY_TRACT | Qty: 3

## 2017-03-03 MED FILL — ONDANSETRON (PF) 4 MG/2 ML INJECTION: 4 mg/2 mL | INTRAMUSCULAR | Qty: 2

## 2017-03-03 MED FILL — OXYCODONE 10 MG TAB: 10 mg | ORAL | Qty: 2

## 2017-03-03 MED FILL — PIPERACILLIN-TAZOBACTAM 3.375 GRAM IV SOLR: 3.375 gram | INTRAVENOUS | Qty: 3.38

## 2017-03-03 MED FILL — VANCOMYCIN 5 GRAM IV SOLR: 5 gram | INTRAVENOUS | Qty: 1000

## 2017-03-03 MED FILL — CLONIDINE 0.1 MG TAB: 0.1 mg | ORAL | Qty: 1

## 2017-03-03 MED FILL — VANCOMYCIN 10 GRAM IV SOLR: 10 gram | INTRAVENOUS | Qty: 1250

## 2017-03-03 MED FILL — PHARMACY VANCOMYCIN NOTE: Qty: 1

## 2017-03-03 MED FILL — ENOXAPARIN 40 MG/0.4 ML SUB-Q SYRINGE: 40 mg/0.4 mL | SUBCUTANEOUS | Qty: 0.4

## 2017-03-03 MED FILL — PANTOPRAZOLE 40 MG TAB, DELAYED RELEASE: 40 mg | ORAL | Qty: 1

## 2017-03-03 MED FILL — SENNA LAX 8.6 MG TABLET: 8.6 mg | ORAL | Qty: 1

## 2017-03-03 MED FILL — TYLENOL 325 MG TABLET: 325 mg | ORAL | Qty: 2

## 2017-03-03 MED FILL — DEXTROSE 5% IN NORMAL SALINE IV: INTRAVENOUS | Qty: 1000

## 2017-03-03 MED FILL — ZOSYN 3.375 GRAM INTRAVENOUS SOLUTION: 3.375 gram | INTRAVENOUS | Qty: 3.38

## 2017-03-03 NOTE — Progress Notes (Signed)
Back to bed at this time. No report of pain or SOB. IVF and IVABT infusing. Will monitor,

## 2017-03-03 NOTE — Progress Notes (Signed)
Resting at this time with productive cough. Medicated for pain as ordered. IVF and IVABT as ordered through. Vanc trough drawn. Will monitor.

## 2017-03-03 NOTE — Progress Notes (Signed)
Pharmacy Dosing Services: Vancomycin    Consult for Vancomycin Dosing by Pharmacy by Dr. Dwyane DeeAtnafu  Indication:Post-Viral CAP/MRSA suspected  Target level:15-20  Day of Therapy:7    Previous Regimen Vanc 1250mg  iv q12hrs   Last Level Trough level on 03/02/17: 11.7   Other Current Antibiotics none   Significant Cultures Blood culture on 02/25/17:MRSA,sputum culture on 02/25/17:MRSA   Serum Creatinine Lab Results   Component Value Date/Time    Creatinine 0.86 03/03/2017 04:00 AM      Creatinine Clearance Estimated Creatinine Clearance: 118.4 mL/min (based on Cr of 0.86).   BUN Lab Results   Component Value Date/Time    BUN 7 03/03/2017 04:00 AM       WBC Lab Results   Component Value Date/Time    WBC 20.4 (H) 03/03/2017 04:00 AM      H/H    Lab Results   Component Value Date/Time    HGB 11.0 (L) 03/03/2017 04:00 AM      Platelets Lab Results   Component Value Date/Time    PLATELET 391 03/03/2017 04:00 AM      Temp 99 ??F (37.2 ??C)     Dose administration notes:   Doses given appropriately as scheduled    Plan for level:Trough level on 03/04/17 @1630  prior to 1700 dose  Adjustments:  none  Plan: Last level subtherapeutic. Dose changed to Vanc 1gm iv q8hrs. Level ordered for tomorrow.Will Continue to monitor

## 2017-03-03 NOTE — Progress Notes (Signed)
Patient Active Problem List   Diagnosis Code   ??? CAP (community acquired pneumonia) J18.9   ??? HTN (hypertension) I10       Subjective: Remained febrile    Pain well controlled with meds    WBC increasing    Physical Examination:  Temp (24hrs), Avg:99.7 ??F (37.6 ??C), Min:97.5 ??F (36.4 ??C), Max:102.5 ??F (39.2 ??C)      Visit Vitals   ??? BP 118/72 (BP Patient Position: At rest)   ??? Pulse 87   ??? Temp 99.3 ??F (37.4 ??C)   ??? Resp 18   ??? Ht 6\' 3"  (1.905 m)   ??? Wt 72.6 kg (160 lb)   ??? SpO2 99%   ??? BMI 20 kg/m2       GENERAL:  In no acute distress, comfortable, lying in bed.  SKIN:   Warm, moist.  Normal skin turgor.  No rash or scratch mark noted.   HEENT:   Normocephalic, Atraumatic,     Pink conjunctiva, Anicteric, EOMI,     No sinus tenderness or stuffy nose and     Clear & moist oral mucosa.  NECK: Supple, no JVD, no thyroidmegammy or lymphadenopathy.  AXILLA: No Lymphadenopathy.  LUNGS:   Unchanged  CARDIAC:  S1 & S2, RRR. No murmurs, rubs or gallops heard  ABDOMEN:   Moves with respiration. Soft, NT, BS +.   INGUINAL: No hernia or lymphadenopathy.   GUS:  No supra pubic area tenderness. No CVAT.  EXTREM.:   No edema.   NEURO:   AOX3, No focal deficits..        Labs Reviewed:  Recent Labs      03/06/17   0600  03/05/17   1345  03/04/17   0935   WBC  10.3  14.5*  21.5*   HGB  9.4*  9.4*  11.0*   HCT  27.5*  27.1*  31.8*   PLT  424  415  460*     Recent Labs      03/06/17   0600  03/05/17   1345  03/04/17   0935   NA  137  134*  136   K  3.7  3.6  3.6   CL  99  99  97*   CO2  27  32  27   GLU  106  112*  89   BUN  7  9  10    CREA  0.97  0.93  1.12   CA  7.4*  7.1*  7.8*   MG  1.9   --   2.1   PHOS   --    --   3.2   ALB  1.5*   --    --    SGOT  35   --    --    ALT  43   --    --      No components found for: GLPOC  No results for input(s): PH, PCO2, PO2, HCO3, FIO2 in the last 72 hours.  No results for input(s): INR in the last 72 hours.    No lab exists for component: INREXT, INREXT    Culture  All Micro Results      Procedure Component Value Units Date/Time    CULTURE, BLOOD [[id number]] Collected:  02/27/17 1830    Order Status:  Completed Specimen:  Blood from Blood Updated:  03/04/17 1017     Special Requests: NO SPECIAL REQUESTS  Culture result: NO GROWTH 5 DAYS       CULTURE, BLOOD [[id number]] Collected:  02/24/17 0430    Order Status:  Completed Specimen:  Whole Blood from Blood Updated:  03/02/17 1507     Special Requests: NO SPECIAL REQUESTS        Culture result: NO GROWTH 6 DAYS       CULTURE, BLOOD [[id number]]  (Abnormal)  (Susceptibility) Collected:  02/25/17 0900    Order Status:  Completed Specimen:  Whole Blood from Blood Updated:  02/28/17 1152     Special Requests: NO SPECIAL REQUESTS        GRAM STAIN         GRAM POS COCCI IN CLUSTERS AEROBIC BOTTLE CALLED RESULTS TO/VERIFIED READ BACK WITH NURSE TODD GREGORY AT 1255 ON 02/26/17 EXT.[id number]     Culture result:         **METHICILLIN RESISTANT STAPHYLOCOCCUS AUREUS** AEROBIC BOTTLE (A)              REMAINING BOTTLE(S) HAS/HAVE NO GROWTH SO FAR    CULTURE, RESPIRATORY/SPUTUM/BRONCH Gay Filler STAIN [[id number]]  (Abnormal)  (Susceptibility) Collected:  02/25/17 1930    Order Status:  Completed Specimen:  Sputum from Sputum Updated:  02/27/17 1232     Special Requests: NO SPECIAL REQUESTS        GRAM STAIN         MANY POLYMORPHONUCLEAR LEUKOCYTES (PMNs)              MODERATE GRAM POS COCCI IN CLUSTERS     Culture result:         HEAVY **METHICILLIN RESISTANT STAPHYLOCOCCUS AUREUS** PATIENT IS A KNOWN MRSA (A)              MODERATE ALPHA STREPTOCOCCUS, NOT S. PNEUMONIAE (A)    CULTURE, URINE [[id number]] Collected:  02/24/17 0630    Order Status:  Completed Specimen:  Urine from Foley Specimen Updated:  02/26/17 1237     Special Requests: NO SPECIAL REQUESTS        Culture result: NO GROWTH 48 HOURS             Urine  Color   Date Value Ref Range Status   02/24/2017 YELLOW   Final     Appearance   Date Value Ref Range Status   02/24/2017 CLEAR   Final      Specific gravity   Date Value Ref Range Status   02/24/2017 1.028   Final     pH (UA)   Date Value Ref Range Status   02/24/2017 5.5 5.0 - 7.0   Final     Protein   Date Value Ref Range Status   02/24/2017 100 (A) NEG mg/dL Final     Ketone   Date Value Ref Range Status   02/24/2017 TRACE (A) NEG mg/dL Final     Bilirubin   Date Value Ref Range Status   02/24/2017 NEGATIVE  NEG   Final     Blood   Date Value Ref Range Status   02/24/2017 MODERATE (A) NEG   Final     Urobilinogen   Date Value Ref Range Status   02/24/2017 1.0 0.2 - 1.0 EU/dL Final     Nitrites   Date Value Ref Range Status   02/24/2017 NEGATIVE  NEG   Final     Leukocyte Esterase   Date Value Ref Range Status   02/24/2017 NEGATIVE  NEG   Final  Radiology Studies:  Xr Chest Pa Lat    Result Date: 03/03/2017  EXAM:  XR CHEST PA LAT dated 03/03/2017 8:12 AM INDICATION:   BL pneumonia with pain, follow up COMPARISON: 02/26/2017. FINDINGS: PA and lateral radiographs of the chest.  There are persistent bilateral lower lobe infiltrates. There are bilateral pleural effusions.     IMPRESSION: There are persistent bilateral lower lobe infiltrates. There are bilateral pleural effusions.     Xr Chest Pa Lat    Result Date: 02/26/2017  EXAM:  XR CHEST PA LAT dated 02/26/2017 10:31 AM INDICATION:   Persistent fever suspect empyema or abscess. COMPARISON: Chest AP semierect film from 02/24/2017.Chest CT scan 02/25/17. PA and lateral radiographs of the chest.  Dense infiltrate or consolidation in the posterior right midlung adjacent to the fissure in the superior segment right lower lobe measuring approximately 8.6 x 7.3 x 7.3 cm has  increased since previous examination 02/24/17.  Infiltrate left lower lobe has increased.  Moderate consolidation left lower lobe again noted Lungs are normally inflated.  Normal heart size..  Right hilar prominence compatible with small nodes unchanged. Marland Kitchen     IMPRESSION: Increasing consolidation posterior right midlung and  increasing infiltrate left lower lobe since previous examination 02/24/17.  Findings are unchanged since CT scan of 02/25/17. Dense consolidation left lower lobe measuring 4.9 cm in diameter, focal consolidation vs. Beginning of an abscess. Close follow-up examination suggested.  Some distention of colon with air unchanged.     Xr Spine Lumb Min 4 V    Result Date: 02/25/2017  EXAM:  XR SPINE LUMB MIN 4 V dated 02/25/2017 12:28 PM INDICATION:   LBP COMPARISON:  None. FINDINGS:  AP, lateral, and oblique views of the lumbar spine were obtained. No fracture or subluxation. No disc space narrowing. No arthritic changes. No osteolytic or osteoblastic lesion. SI joints are unremarkable. There is opacification of both renal collecting system and ureters from the intravenous contrast injection of the prior CT.     IMPRESSION:   Normal exam.     Ct Chest Wo Cont    Result Date: 02/28/2017  EXAM:  CT CHEST WO CONT  dated 02/28/2017 2:38 PM INDICATION:  MRSA pneumonia worsening suspect abscess formation COMPARISON: CT scan the chest with contrast injection 02/25/17.  CONTRAST:  None. TECHNIQUE: Spiral CT through the thorax without contrast was performed. 2.5 mm axial images with sagittal and coronal reconstructions produced. CT dose reduction was achieved through use of a standardized protocol tailored for this examination and automatic exposure control for dose modulation. The absence of intravenous contrast prevents evaluation for pulmonary emboli or aortic dissection. FINDINGS:Small bulla in the left and right upper lobes.  Focal infiltrate in the anterior part of the left upper lobe unchanged since the previous examination. Bilateral basilar consolidation again noted.  On the right side it measures approximately 10 cm in craniocaudal dimension unchanged since the previous examination.  On the left side it measures approximately 10 cm , increasing from approximately  9 cm on the previous study.  No significant pleural effusion or empyema detected at this time.  No cavitary lesions other than small bulla within the lung fields.\\ 7.1 mm anterior inferior pericardial effusion again noted.  Right hemidiaphragm elevation.  Heart size is normal.     IMPRESSION: Bibasilar consolidation in the lung fields left side slightly larger again noted with small infiltrate left upper lobe and bullous changes the left and right lung apices. No cavitary lesion or loculated pleural effusion  detected on the current examination.  Trace pericardial effusion measuring 7.1 mm appearing since previous examination.  Right hemidiaphragm elevation.     Ct Chest W Cont    Result Date: 02/25/2017  CT CHEST W CONT dated 02/25/2017 12:19 PM INDICATION:  Pneumonia, Hematemesis COMPARISON: None TECHNIQUE: Spiral CT with IV contrast bolus through the thorax was performed. 2.5 mm axial images with sagittal and coronal reconstructions produced. Contrast material 100 mL Isovue 300. One or more of the following dose reduction techniques were used: automated exposure control, adjustment of the mA and/or kV according to patient size, use of iterative reconstruction technique. INTERPRETATION:  There is less than optimal opacification of the pulmonary arterial tree. No definite pulmonary emboli can be seen. The aorta is unremarkable. There are dense consolidative infiltrates in both lower lobes, and there is patchy infiltration in the left upper lobe. No definite pleural effusion is seen. Multiple air bronchograms are seen in the lower lobes. No hilar or mediastinal mass is identified. The tracheobronchial tree is unremarkable. The thyroid is unremarkable. No cervical lymphadenopathy is seen. No chest wall abnormality is observed. Limited images the upper abdomen show no significant abnormality. The visualized skeletal structures are unremarkable.     IMPRESSION: 1.  Dense bilateral lower lobe infiltrates consistent with  pneumonia. There is also patchy infiltration in the left upper lobe. 2.  Suboptimal opacification of the pulmonary arterial tree. No gross pulmonary embolic disease is observed.    Xr Chest Port    Result Date: 02/24/2017  EXAM:  XR CHEST PORT dated 02/24/2017 8:31 AM INDICATION:  pre op. Nurses note states patient coughing up blood-tinged sputum. COMPARISON:  None. FINDINGS: A portable AP semierect radiograph of the chest was obtained at 0819 hours. There are bilateral lower lung zone infiltrates slightly more prominent on the left than the right. There is no obscuration of the diaphragm or of the heart border, and localization of the infiltrates is difficult. Cannot rule out overlying pleural disease. The upper lung zones are clear. Heart size is normal. No hilar or mediastinal mass.     IMPRESSION:  Bilateral lower lung zone densities consistent with bilateral infiltrates. Consider conventional PA and lateral views for better characterization.     Assessment: Same    Today's lab was noted.    Plan:  Continue current meds    Monitor culture, fluid and electrolyte.    If WBC continue to increase and fever persist, may need to change antibiotics regimen.   `

## 2017-03-03 NOTE — Progress Notes (Signed)
Patient reports pain. Medicated at this time. Reports " I dont want fruit anymore, I think my appetite is coming back ". Will monitor.

## 2017-03-03 NOTE — Progress Notes (Signed)
Patient Active Problem List   Diagnosis Code   ??? CAP (community acquired pneumonia) J18.9   ??? HTN (hypertension) I10       Subjective: No new complaints    Looks comfortable    Continue to have fever    Pain well controlled with the new regimen    Still has on and off SOB    Physical Examination:  Temp (24hrs), Avg:100.5 ??F (38.1 ??C), Min:98.6 ??F (37 ??C), Max:103.3 ??F (39.6 ??C)      Visit Vitals   ??? BP 100/63 (BP 1 Location: Right arm, BP Patient Position: At rest)   ??? Pulse 76   ??? Temp 98.6 ??F (37 ??C)   ??? Resp 18   ??? Ht 6\' 3"  (1.905 m)   ??? Wt 72.6 kg (160 lb)   ??? SpO2 92%   ??? BMI 20 kg/m2       GENERAL:  In no acute distress, comfortable, lying in bed.  SKIN:   Warm, moist.  Normal skin turgor.  No rash or scratch mark noted.   HEENT:   Normocephalic, Atraumatic,     Pink conjunctiva, Anicteric, EOMI,     No sinus tenderness or stuffy nose and     Clear & moist oral mucosa.  NECK: Supple, no JVD, no thyroidmegammy or lymphadenopathy.  AXILLA: No Lymphadenopathy.  LUNGS:   Symmetrical, Resonant and CTA BL. No wheezes, rales,or rhonchi,  CARDIAC:  S1 & S2, RRR. No murmurs, rubs or gallops heard  ABDOMEN:   Moves with respiration. Soft, NT, BS +.   INGUINAL: No hernia or lymphadenopathy.   GUS:  No supra pubic area tenderness. No CVAT.  EXTREM.:   No edema.   NEURO:   AOX3, No focal deficits..        Labs Reviewed:  Recent Labs      03/04/17   0935  03/03/17   0400   WBC  21.5*  20.4*   HGB  11.0*  11.0*   HCT  31.8*  31.4*   PLT  460*  391     Recent Labs      03/04/17   0935  03/03/17   0400   NA  136  133*   K  3.6  3.7   CL  97*  97*   CO2  27  26   GLU  89  93   BUN  10  7   CREA  1.12  0.86   CA  7.8*  6.9*   MG  2.1  1.5*   PHOS  3.2   --    ALB   --   1.6*   SGOT   --   65*   ALT   --   52     No components found for: GLPOC  No results for input(s): PH, PCO2, PO2, HCO3, FIO2 in the last 72 hours.  No results for input(s): INR in the last 72 hours.    No lab exists for component: INREXT, INREXT    Culture   All Micro Results     Procedure Component Value Units Date/Time    CULTURE, BLOOD [[id number]] Collected:  02/27/17 1830    Order Status:  Completed Specimen:  Blood from Blood Updated:  03/04/17 1017     Special Requests: NO SPECIAL REQUESTS        Culture result: NO GROWTH 5 DAYS       CULTURE, BLOOD [[id number]] Collected:  02/24/17 0430  Order Status:  Completed Specimen:  Whole Blood from Blood Updated:  03/02/17 1507     Special Requests: NO SPECIAL REQUESTS        Culture result: NO GROWTH 6 DAYS       CULTURE, BLOOD [[id number]]  (Abnormal)  (Susceptibility) Collected:  02/25/17 0900    Order Status:  Completed Specimen:  Whole Blood from Blood Updated:  02/28/17 1152     Special Requests: NO SPECIAL REQUESTS        GRAM STAIN         GRAM POS COCCI IN CLUSTERS AEROBIC BOTTLE CALLED RESULTS TO/VERIFIED READ BACK WITH NURSE TODD GREGORY AT 1255 ON 02/26/17 EXT.3426     Culture result:         **METHICILLIN RESISTANT STAPHYLOCOCCUS AUREUS** AEROBIC BOTTLE (A)              REMAINING BOTTLE(S) HAS/HAVE NO GROWTH SO FAR    CULTURE, RESPIRATORY/SPUTUM/BRONCH Gay Filler STAIN [[id number]]  (Abnormal)  (Susceptibility) Collected:  02/25/17 1930    Order Status:  Completed Specimen:  Sputum from Sputum Updated:  02/27/17 1232     Special Requests: NO SPECIAL REQUESTS        GRAM STAIN         MANY POLYMORPHONUCLEAR LEUKOCYTES (PMNs)              MODERATE GRAM POS COCCI IN CLUSTERS     Culture result:         HEAVY **METHICILLIN RESISTANT STAPHYLOCOCCUS AUREUS** PATIENT IS A KNOWN MRSA (A)              MODERATE ALPHA STREPTOCOCCUS, NOT S. PNEUMONIAE (A)    CULTURE, URINE [[id number]] Collected:  02/24/17 0630    Order Status:  Completed Specimen:  Urine from Foley Specimen Updated:  02/26/17 1237     Special Requests: NO SPECIAL REQUESTS        Culture result: NO GROWTH 48 HOURS             Urine  Color   Date Value Ref Range Status   02/24/2017 YELLOW   Final     Appearance   Date Value Ref Range Status    02/24/2017 CLEAR   Final     Specific gravity   Date Value Ref Range Status   02/24/2017 1.028   Final     pH (UA)   Date Value Ref Range Status   02/24/2017 5.5 5.0 - 7.0   Final     Protein   Date Value Ref Range Status   02/24/2017 100 (A) NEG mg/dL Final     Ketone   Date Value Ref Range Status   02/24/2017 TRACE (A) NEG mg/dL Final     Bilirubin   Date Value Ref Range Status   02/24/2017 NEGATIVE  NEG   Final     Blood   Date Value Ref Range Status   02/24/2017 MODERATE (A) NEG   Final     Urobilinogen   Date Value Ref Range Status   02/24/2017 1.0 0.2 - 1.0 EU/dL Final     Nitrites   Date Value Ref Range Status   02/24/2017 NEGATIVE  NEG   Final     Leukocyte Esterase   Date Value Ref Range Status   02/24/2017 NEGATIVE  NEG   Final       Radiology Studies:  Xr Chest Pa Lat    Result Date: 03/03/2017  EXAM:  XR CHEST PA LAT dated 03/03/2017  8:12 AM INDICATION:   BL pneumonia with pain, follow up COMPARISON: 02/26/2017. FINDINGS: PA and lateral radiographs of the chest.  There are persistent bilateral lower lobe infiltrates. There are bilateral pleural effusions.     IMPRESSION: There are persistent bilateral lower lobe infiltrates. There are bilateral pleural effusions.     Xr Chest Pa Lat    Result Date: 02/26/2017  EXAM:  XR CHEST PA LAT dated 02/26/2017 10:31 AM INDICATION:   Persistent fever suspect empyema or abscess. COMPARISON: Chest AP semierect film from 02/24/2017.Chest CT scan 02/25/17. PA and lateral radiographs of the chest.  Dense infiltrate or consolidation in the posterior right midlung adjacent to the fissure in the superior segment right lower lobe measuring approximately 8.6 x 7.3 x 7.3 cm has  increased since previous examination 02/24/17.  Infiltrate left lower lobe has increased.  Moderate consolidation left lower lobe again noted Lungs are normally inflated.  Normal heart size..  Right hilar prominence compatible with small nodes unchanged. Marland Kitchen      IMPRESSION: Increasing consolidation posterior right midlung and increasing infiltrate left lower lobe since previous examination 02/24/17.  Findings are unchanged since CT scan of 02/25/17. Dense consolidation left lower lobe measuring 4.9 cm in diameter, focal consolidation vs. Beginning of an abscess. Close follow-up examination suggested.  Some distention of colon with air unchanged.     Xr Spine Lumb Min 4 V    Result Date: 02/25/2017  EXAM:  XR SPINE LUMB MIN 4 V dated 02/25/2017 12:28 PM INDICATION:   LBP COMPARISON:  None. FINDINGS:  AP, lateral, and oblique views of the lumbar spine were obtained. No fracture or subluxation. No disc space narrowing. No arthritic changes. No osteolytic or osteoblastic lesion. SI joints are unremarkable. There is opacification of both renal collecting system and ureters from the intravenous contrast injection of the prior CT.     IMPRESSION:   Normal exam.     Ct Chest Wo Cont    Result Date: 02/28/2017  EXAM:  CT CHEST WO CONT  dated 02/28/2017 2:38 PM INDICATION:  MRSA pneumonia worsening suspect abscess formation COMPARISON: CT scan the chest with contrast injection 02/25/17.  CONTRAST:  None. TECHNIQUE: Spiral CT through the thorax without contrast was performed. 2.5 mm axial images with sagittal and coronal reconstructions produced. CT dose reduction was achieved through use of a standardized protocol tailored for this examination and automatic exposure control for dose modulation. The absence of intravenous contrast prevents evaluation for pulmonary emboli or aortic dissection. FINDINGS:Small bulla in the left and right upper lobes.  Focal infiltrate in the anterior part of the left upper lobe unchanged since the previous examination. Bilateral basilar consolidation again noted.  On the right side it measures approximately 10 cm in craniocaudal dimension unchanged since the previous examination.  On the left side it measures approximately 10 cm , increasing from approximately  9 cm on the previous study.  No significant pleural effusion or empyema detected at this time.  No cavitary lesions other than small bulla within the lung fields.\\ 7.1 mm anterior inferior pericardial effusion again noted.  Right hemidiaphragm elevation.  Heart size is normal.     IMPRESSION: Bibasilar consolidation in the lung fields left side slightly larger again noted with small infiltrate left upper lobe and bullous changes the left and right lung apices. No cavitary lesion or loculated pleural effusion detected on the current examination.  Trace pericardial effusion measuring 7.1 mm appearing since previous examination.  Right hemidiaphragm elevation.  Ct Chest W Cont    Result Date: 02/25/2017  CT CHEST W CONT dated 02/25/2017 12:19 PM INDICATION:  Pneumonia, Hematemesis COMPARISON: None TECHNIQUE: Spiral CT with IV contrast bolus through the thorax was performed. 2.5 mm axial images with sagittal and coronal reconstructions produced. Contrast material 100 mL Isovue 300. One or more of the following dose reduction techniques were used: automated exposure control, adjustment of the mA and/or kV according to patient size, use of iterative reconstruction technique. INTERPRETATION:  There is less than optimal opacification of the pulmonary arterial tree. No definite pulmonary emboli can be seen. The aorta is unremarkable. There are dense consolidative infiltrates in both lower lobes, and there is patchy infiltration in the left upper lobe. No definite pleural effusion is seen. Multiple air bronchograms are seen in the lower lobes. No hilar or mediastinal mass is identified. The tracheobronchial tree is unremarkable. The thyroid is unremarkable. No cervical lymphadenopathy is seen. No chest wall abnormality is observed. Limited images the upper abdomen show no significant abnormality. The visualized skeletal structures are unremarkable.     IMPRESSION: 1.  Dense bilateral lower lobe infiltrates consistent with  pneumonia. There is also patchy infiltration in the left upper lobe. 2.  Suboptimal opacification of the pulmonary arterial tree. No gross pulmonary embolic disease is observed.    Xr Chest Port    Result Date: 02/24/2017  EXAM:  XR CHEST PORT dated 02/24/2017 8:31 AM INDICATION:  pre op. Nurses note states patient coughing up blood-tinged sputum. COMPARISON:  None. FINDINGS: A portable AP semierect radiograph of the chest was obtained at 0819 hours. There are bilateral lower lung zone infiltrates slightly more prominent on the left than the right. There is no obscuration of the diaphragm or of the heart border, and localization of the infiltrates is difficult. Cannot rule out overlying pleural disease. The upper lung zones are clear. Heart size is normal. No hilar or mediastinal mass.     IMPRESSION:  Bilateral lower lung zone densities consistent with bilateral infiltrates. Consider conventional PA and lateral views for better characterization.     Assessment: Same    Today's lab was noted.    Plan:  Continue current meds    Monitor culture, fluid and electrolyte.    PA and [place] chest x-ray   `

## 2017-03-03 NOTE — Other (Signed)
Bedside, Verbal and Written shift change report given to todd (oncoming nurse) by Marylu Lundjanet (offgoing nurse). Report included the following information SBAR, Kardex, MAR and Recent Results.

## 2017-03-03 NOTE — Other (Signed)
Verbal shift change report given to Todd Gregory RN (oncoming nurse) by Anisse Lewis RN (offgoing nurse). Report included the following information SBAR and Kardex.

## 2017-03-03 NOTE — Progress Notes (Signed)
Patient up in chair since 0830 hours. No report of SOB or pain at this time. Taught to use the IS. Did 10 breaths at 500 cc. Encouraged to be more.

## 2017-03-03 NOTE — Progress Notes (Signed)
Patient got up oob to bathroom without assistance. Will monitor.

## 2017-03-03 NOTE — Progress Notes (Signed)
Resting with no report of pain or SOB at this time. IVF and IVABT as ordered. Will monitor.

## 2017-03-03 NOTE — Other (Signed)
Alert and oriented x 4 with no SOB or pain reported. Patient refuses to wear nasal canula. IVF and IVABT infusing through a right hand # 20. Reports pain at 9/10, but reports the oxycodone 20mg  ' works better '. Will give ABT and monitor.

## 2017-03-03 NOTE — Progress Notes (Signed)
Problem: Falls - Risk of  Goal: *Absence of Falls  Document Schmid Fall Risk and appropriate interventions in the flowsheet.   Outcome: Progressing Towards Goal  Fall Risk Interventions:  Mobility Interventions: Patient to call before getting OOB, Communicate number of staff needed for ambulation/transfer, Strengthening exercises (ROM-active/passive)  No reported falls       Medication Interventions: Patient to call before getting OOB, Teach patient to arise slowly    Elimination Interventions: Call light in reach, Urinal in reach

## 2017-03-03 NOTE — Other (Signed)
Utilization Review         ?? Pneumonia, Community Acquired - Care Day 8 (03/02/2017) by Christene LyeAudrey Forsen  ??   ?? Review Status Review Entered ??   ?? Completed 03/03/2017 ??   ?? Details ??   ??  ??   ?? Care Day: 8 Care Date: 03/02/2017 Level of Care: Telemetry ??   ?? Guideline Day 2  ??   ?? Clinical Status ??   ?? (X) * No CO2 retention or acidosis ??   ?? (X) * No requirement for mechanical ventilation ??   ?? (X) * Hypotension absent ??   ?? 03/03/2017 8:51 AM EST by Christene LyeForsen, Audrey ??   ?? ?? BP 155/98, P 82, R 18, ??   ??  ??   ?? ( ) * Fever absent or reduced ??   ?? 03/03/2017 8:51 AM EST by Christene LyeForsen, Audrey ??   ?? ?? T 99.1, ??   ??  ??   ?? (X) * No hypoxia on room air or oxygenation improved ??   ?? 03/03/2017 8:51 AM EST by Christene LyeForsen, Audrey ??   ?? ?? 02 SAT 95% RA ??   ??  ??   ?? (X) * Mental status improved or at baseline ??   ??  ??   ?? Activity ??   ?? (X) * Increased activity ??   ??  ??   ?? Routes ??   ?? ( ) Oral hydration, medications ??   ?? 03/03/2017 8:51 AM EST by Christene LyeForsen, Audrey ??   ?? ?? DUO NEBS Q 6 HRS, CATAPRES PO Q 8 HRS, IV FLS 100 ML/HR, ZOFRAN IV Q 4 HRS PRN X1, ROXICODONE PO Q 4 HRS PRN ??X1, PERCOCET PO Q 4 HRS PRN X4, PROTONIX IV CHANGED TO ??PO QD, TAMIFLU DC????????D, ??   ??  ??   ?? (X) Usual diet ??   ??  ??   ?? Medications ??   ?? (X) IV or oral antibiotics ??   ?? 03/03/2017 8:51 AM EST by Christene LyeForsen, Audrey ??   ?? ?? ZOSYN IV Q 8 HRS, VANCOMYCIN IV Q 12 HRS, ??   ??  ??   ??  ??   ?? 03/03/2017 8:52 AM EST by Christene LyeForsen, Audrey ??   ?? Subject: Additional Clinical Information ??   ?? PROGRESS NOTE:Complains of Left sided CP when taking deep breath or moving around. ?? Fever and cough decreased. ?? Cough is productive and has brownish phlegm. No gross hemoptysis anymore. ?? Refused to put Oxygen on and saturates in higher 90's. ?? Repeat blood culture negative. ?? ECHO report pending. ?? BP continue to be high ??   ??   Plan: ?? Continue current meds. Monitor culture, fluid and electrolyte. ?? ?? Follow ECHO report. ?? Repeat PA and lateral chest x-ray. ?? Add low dose of  Clonidine. ?? Increase dose of Oxycodone. ?? ?? ?? ?? ?? ?? ?? ?? ?? ?? ?? ?? ?? ?? ?? ?? ?? ?? ?? ?? CONTACT ISOLATION, ?? ??   ??    ??   ??  ??   ??  ??   ?? 03/03/2017 8:52 AM EST by Christene LyeForsen, Audrey ??   ?? Subject: Additional Clinical Information ??   ?? PROGRESS NOTE:Complains of Left sided CP when taking deep breath or moving around. ?? Fever and cough decreased. ?? Cough is productive and has brownish phlegm. No gross hemoptysis anymore. ?? Refused to put Oxygen on and saturates  in higher 90's. ?? Repeat blood culture negative. ?? ECHO report pending. ?? BP continue to be high ??   ??   Plan: ?? Continue current meds. Monitor culture, fluid and electrolyte. ?? ?? Follow ECHO report. ?? Repeat PA and lateral chest x-ray. ?? Add low dose of Clonidine. ?? Increase dose of Oxycodone. ?? ?? ?? ?? ?? ?? ?? ?? ?? ?? ?? ?? ?? ?? ?? ?? ?? ?? ?? ?? CONTACT ISOLATION, ?? ??   ??    ??   ??  ??   ??  ??   ??  ??   ??  ??   ??  ??   ?? * Milestone ??   ??  ??   ??   ?? Pneumonia, Community Acquired - Care Day 7 (03/01/2017) by Christene Lye  ??   ?? Review Status Review Entered ??   ?? Completed 03/03/2017 ??   ?? Details ??   ??  ??   ?? Care Day: 7 Care Date: 03/01/2017 Level of Care: Telemetry ??   ?? Guideline Day 2  ??   ?? Clinical Status ??   ?? (X) * No CO2 retention or acidosis ??   ?? (X) * No requirement for mechanical ventilation ??   ?? (X) * Hypotension absent ??   ?? ( ) * Fever absent or reduced ??   ?? 03/03/2017 8:41 AM EST by Christene Lye ??   ?? ?? T 101.4, ??   ??  ??   ?? (X) * No hypoxia on room air or oxygenation improved ??   ?? 03/03/2017 8:41 AM EST by Christene Lye ??   ?? ?? 02 SAT 93% RA ??   ??  ??   ?? (X) * Mental status improved or at baseline ??   ??  ??   ?? Activity ??   ?? (X) * Increased activity ??   ??  ??   ?? Routes ??   ?? ( ) Oral hydration, medications ??   ?? 03/03/2017 8:41 AM EST by Christene Lye ??   ?? ?? DUO NEBS Q 6 HRS, IV FLS 100 ML/HR, LOVENOX SC Q 24 HRS, ZOFRAN 4 MG IV Q 4 HRS PRN X 3, PERCOCET PO Q 4 HRS PRN X 3, TAMIFLU PO BID, PROTONIX IV QD ??   ??  ??   ?? (X) Usual diet ??   ??  ??   ?? Medications ??    ?? (X) IV or oral antibiotics ??   ?? 03/03/2017 8:41 AM EST by Christene Lye ??   ?? ?? ZOSYN IV Q 8 HRS, VANCOMYCIN IV Q 12 HRS, ??   ??  ??   ??  ??   ?? 03/03/2017 8:41 AM EST by Christene Lye ??   ?? Subject: Additional Clinical Information ??   ?? PROGRESS NOTE:Still complains of LLL pain. Request to be on ATIVAN stating that he is anxious and nobody witnessed it. ?? ?? ?? He also stated that he prefers pain meds in IV form. ?? CT scan result explained. ECHO done. ?? EXAM: ?? LUNGS: ?? Symmetrical, Resonant and BL LL crackles ??   ??   BP 143/93-159/104, P 107, R 20, ????Assessment: ?? SamePlan: Continue current meds. ?? ?? Monitor culture, fluid and electrolyte. ?? ?? Follow ECHO result ?? ?? ?? ?? ?? ?? ?? ?? ?? ?? ?? ?? ?? ?? ?? ?? ?? ?? ?? ?? ?? `R.T. NOTE: ?? SpO2 is 92%  on room air. ?? Pt was placed on 2LNC post 8pm neb tx; nasal cannula was under the pt's pillow when RT returned for the 2 am neb tx. ?? Pt's breath sounds are diminished in the lower lobes. ?? Tan bloody sputum is noted in pt's basin. No SOB or respiratory distress noted at this time. Neb tx given as ordered, ??   ??    ??   ??  ??   ??  ??   ??  ??   ??  ??   ??  ??   ?? * Milestone ??   ??  ??   ??   ?? Pneumonia, Community Acquired - Care Day 6 (02/28/2017) by Christene Lye  ??   ?? Review Status Review Entered ??   ?? Completed 03/03/2017 ??   ?? Details ??   ??  ??   ?? Care Day: 6 Care Date: 02/28/2017 Level of Care: Telemetry ??   ?? Guideline Day 2  ??   ?? Clinical Status ??   ?? (X) * No CO2 retention or acidosis ??   ?? (X) * No requirement for mechanical ventilation ??   ?? (X) * Hypotension absent ??   ?? ( ) * Fever absent or reduced ??   ?? (X) * No hypoxia on room air or oxygenation improved ??   ?? (X) * Mental status improved or at baseline ??   ??  ??   ?? Activity ??   ?? (X) * Increased activity ??   ??  ??   ?? Routes ??   ?? (X) Oral hydration, medications ??   ?? 03/03/2017 8:32 AM EST by Christene Lye ??   ?? ?? PERCOCET PO Q 4 HRS PRN X4 (8X/24 HRS), TAMIFLU PO BID, ??   ??  ??   ?? (X) Usual diet ??   ??  ??   ?? Medications ??    ?? (X) IV or oral antibiotics ??   ?? 03/03/2017 8:32 AM EST by Christene Lye ??   ?? ?? ZOSYN IV Q 8 HRS, VANCOMYCIN IV CHANGED TO 1250 MG ??Q 12 HRS, ??   ??  ??   ??  ??   ??  ??   ??  ??   ??  ??   ?? * Milestone ??   ??  ??   ?? Additional Notes ??   ?? PROGRESS NOTE: ??   ?? Pain and hemoptysis worsened. LUNGS: ??Symmetrical, Resonant and CTA BL. No wheezes, rales,or rhonchi. ?? ??   ??  ??   ?? T 98.7, BP 150/104, P 84, R 18 ??   ??  ??   ?? CT CHEST: ??Bibasilar consolidation in the lung fields left side slightly larger again noted with small infiltrate left upper lobe and bullous changes the left and right lung apices.No cavitary lesion or loculated pleural effusion detected on the current examination. ??Trace pericardial effusion measuring 7.1 mm appearing since previous examination. ??Right hemidiaphragm elevation. ??   ?? ??Assessment ??Suspect abscess formation. ??   ?? ?? ??   ?? Plan: ??Continue current meds. Monitor culture, fluid and electrolyte. Follow CT scan result ??   ??  ??   ?? DUO NEBS Q 6 HRS, LOVENOX SC Q 24 HRS,

## 2017-03-04 LAB — METABOLIC PANEL, BASIC
Anion gap: 16 mmol/L (ref 10–17)
BUN/Creatinine ratio: 9 (ref 6.0–20.0)
BUN: 10 MG/DL (ref 7–18)
CO2: 27 mmol/L (ref 21–32)
Calcium: 7.8 MG/DL — ABNORMAL LOW (ref 8.5–10.1)
Chloride: 97 mmol/L — ABNORMAL LOW (ref 98–107)
Creatinine: 1.12 MG/DL (ref 0.6–1.3)
GFR est AA: >60 mL/min/{1.73_m2} (ref >60)
GFR est non-AA: >60 mL/min/{1.73_m2} (ref >60)
Glucose: 89 mg/dL (ref 74–106)
Potassium: 3.6 mmol/L (ref 3.50–5.10)
Sodium: 136 mmol/L (ref 136–145)

## 2017-03-04 LAB — CULTURE, BLOOD: Culture result:: NO GROWTH

## 2017-03-04 LAB — CBC WITH AUTOMATED DIFF
ABS. BASOPHILS: 0 10*3/uL (ref 0.0–0.2)
ABS. EOSINOPHILS: 0 10*3/uL (ref 0.0–0.7)
ABS. LYMPHOCYTES: 1.3 10*3/uL (ref 1.2–3.4)
ABS. MONOCYTES: 1.2 10*3/uL (ref 1.1–3.2)
ABS. NEUTROPHILS: 18.8 10*3/uL — ABNORMAL HIGH (ref 1.4–6.5)
BASOPHILS: 0 % (ref 0–2)
EOSINOPHILS: 0 % (ref 0–5)
HCT: 31.8 % — ABNORMAL LOW (ref 36.8–45.2)
HGB: 11 g/dL — ABNORMAL LOW (ref 12.8–15.0)
IMMATURE GRANULOCYTES: 1 % (ref 0.0–5.0)
LYMPHOCYTES: 6 % — ABNORMAL LOW (ref 16–40)
MCH: 28.6 PG (ref 27–31)
MCHC: 34.6 g/dL (ref 32–36)
MCV: 82.6 FL (ref 81–99)
MONOCYTES: 5 % (ref 0–12)
MPV: 9.5 FL (ref 7.4–10.4)
NEUTROPHILS: 89 % — ABNORMAL HIGH (ref 40–70)
NRBC: 0 PER 100 WBC
PLATELET: 460 10*3/uL — ABNORMAL HIGH (ref 140–450)
RBC: 3.85 M/uL — ABNORMAL LOW (ref 4.0–5.2)
RDW: 13.3 % (ref 11.5–14.5)
WBC: 21.5 10*3/uL — ABNORMAL HIGH (ref 4.8–10.8)

## 2017-03-04 LAB — PHOSPHORUS: Phosphorus: 3.2 MG/DL (ref 2.50–4.90)

## 2017-03-04 LAB — MAGNESIUM: Magnesium: 2.1 mg/dL (ref 1.8–2.4)

## 2017-03-04 MED ORDER — CLINDAMYCIN 150 MG CAP
150 mg | Freq: Four times a day (QID) | ORAL | Status: DC
Start: 2017-03-04 — End: 2017-03-11
  Administered 2017-03-04 – 2017-03-11 (×26): via ORAL

## 2017-03-04 MED ORDER — LINEZOLID 600 MG/300 ML D5W IV
600 mg/300 mL | Freq: Two times a day (BID) | INTRAVENOUS | Status: DC
Start: 2017-03-04 — End: 2017-03-12
  Administered 2017-03-05 – 2017-03-12 (×16): via INTRAVENOUS

## 2017-03-04 MED FILL — OXYCODONE 10 MG TAB: 10 mg | ORAL | Qty: 2

## 2017-03-04 MED FILL — CLONIDINE 0.1 MG TAB: 0.1 mg | ORAL | Qty: 1

## 2017-03-04 MED FILL — SENNA LAX 8.6 MG TABLET: 8.6 mg | ORAL | Qty: 1

## 2017-03-04 MED FILL — VANCOMYCIN 10 GRAM IV SOLR: 10 gram | INTRAVENOUS | Qty: 1000

## 2017-03-04 MED FILL — ENOXAPARIN 40 MG/0.4 ML SUB-Q SYRINGE: 40 mg/0.4 mL | SUBCUTANEOUS | Qty: 0.4

## 2017-03-04 MED FILL — CLINDAMYCIN 150 MG CAP: 150 mg | ORAL | Qty: 2

## 2017-03-04 MED FILL — ZOSYN 3.375 GRAM INTRAVENOUS SOLUTION: 3.375 gram | INTRAVENOUS | Qty: 3.38

## 2017-03-04 MED FILL — TYLENOL 325 MG TABLET: 325 mg | ORAL | Qty: 2

## 2017-03-04 MED FILL — IPRATROPIUM-ALBUTEROL 2.5 MG-0.5 MG/3 ML NEB SOLUTION: 2.5 mg-0.5 mg/3 ml | RESPIRATORY_TRACT | Qty: 3

## 2017-03-04 MED FILL — OXYCODONE-ACETAMINOPHEN 5 MG-325 MG TAB: 5-325 mg | ORAL | Qty: 2

## 2017-03-04 MED FILL — ZYVOX 600 MG/300 ML INTRAVENOUS PIGGYBACK: 600 mg/300 mL | INTRAVENOUS | Qty: 300

## 2017-03-04 MED FILL — FAMOTIDINE 20 MG TAB: 20 mg | ORAL | Qty: 1

## 2017-03-04 MED FILL — ONDANSETRON (PF) 4 MG/2 ML INJECTION: 4 mg/2 mL | INTRAMUSCULAR | Qty: 2

## 2017-03-04 MED FILL — DEXTROSE 5% IN NORMAL SALINE IV: INTRAVENOUS | Qty: 1000

## 2017-03-04 NOTE — Progress Notes (Signed)
NUTRITION follow up:    PLAN / RECOMMENDATIONS:   1. Continue current Regular Diet + Ensure Enlive TID as tolerated.  2. Will discontinue Magic Cups TID.  3. Monitor electrolyte balance and maintain to within normal limits.  4. RD to follow per protocol.    GOALS:   1. Continue to consume 75% or greater of all meals and supplements over the next 7 days.    SUBJECTIVE/ OBJECTIVE:   3/6: Per discussion with pt, improved appetite, consuming +/-75% of meals and supplements at this time. Pt reports he is now eating a variety of foods (not only fruits). Pt states he does not like Magic Cups--will discontinue. Pt also reports being lactose intolerant and also cannot drink OJ or eat eggs. Pt reports last episode of emesis x 2 days ago, but no longer feels nauseous. No other GI complaints at this time. Hyponatremia resolved, all other labs WNL at this time. Continues on IVF. Pt currently meeting est nutritional needs at this time.    3/2: Pt seen today based on MD consult for general nutrition management and supplements. Per discussion with pt, poor appetite since admission and has only started eating x 2 days ago. Pt reports poor appetite PTA x 2 days, meaning pt has had <50% est energy requirements x 5 days. Pt reports wt loss r/t poor appetite but unsure of amount of wt lost. No recent measured wt on file. Pt with covers pulled to neck, unable to assess for physical signs of malnutrition, however pt with slight temporal wasting. Pt reports he only eats fruits and observed <25% of fruit platter for lunch consumed. Pt willing to receive Ensure and Magic Cup to help meet est nutritional needs. Pt admits to now resolved diarrhea, no other GI complaints at this time. No known food allergies. No wounds noted. Hyponatremia noted. Currently on IVF.      Diet: Regular + Ensure Enlive TID + Magic Cup TID  Medications: '[x]'  Reviewed (pepcid, senna, zofran prn)  Gtts: D5NS at 134m/hr (provides 408kcals/day)  Labs:  '[x]'  Reviewed      Lab Results   Component Value Date/Time    Sodium 136 03/04/2017 09:35 AM    Potassium 3.6 03/04/2017 09:35 AM    Chloride 97 (L) 03/04/2017 09:35 AM    CO2 27 03/04/2017 09:35 AM    Glucose 89 03/04/2017 09:35 AM    BUN 10 03/04/2017 09:35 AM    Creatinine 1.12 03/04/2017 09:35 AM    Calcium 7.8 (L) 03/04/2017 09:35 AM    Magnesium 2.1 03/04/2017 09:35 AM    Phosphorus 3.2 03/04/2017 09:35 AM    Albumin 1.6 (L) 03/03/2017 04:00 AM         Intake/Output Summary (Last 24 hours) at 03/04/17 1442  Last data filed at 03/04/17 1413   Gross per 24 hour   Intake          4893.34 ml   Output             2500 ml   Net          2393.34 ml       Anthropometric   Assessment  Height: '6\' 3"'  (190.5 cm)  Weight: 72.6 kg (160 lb)  Weight Source: Patient stated  BMI (calculated): 20  Usual Body Weight: 72.6 kg (160 lb)  Last 3 Recorded Weights in this Encounter    02/23/17 2117   Weight: 72.6 kg (160 lb)        Estimated Nutrition Needs:  [  x] MSJ  '[]'  [address] 2003b '[]'  Other ____________  3662 Kcals/day (1.2-1.3 stress factor= 2072-2245kcals/day), Protein (g):  (0.8-1gm/kg= 58-73gm/day) Fluid (ml):  (52m/kcal= 2072-22443mday)  Based on:   '[x]'   Actual BW (72.6kg)   '[]'   IBW   '[]'  Adjusted BW      ASSESSMENT:     Nutrition Diagnoses:   No nutritional dx at this time.    Nutrition Interventions:  Intervention :Food/Nutrient Delivery: Yes  Meals/Snacks: General/healthful diet  Supplements: Commercial supplement  Recommended Diet/Supplements: Continue current diet      Education needs:   '[x]'   No educational needs identified    '[]'   The following education needs were addressed: _____________   '[x]'   No cultural, religious, or ethnic dietary needs identified    '[]'   Cultural, religious and ethnic food preferences identified and addressed    '[x]'   Participated in care plan, discharge planning/Interdisciplinary rounds. Discharge plan: __General healthy diet______     Nutrition Monitoring and Evaluation (for follow up notes only):   Previous Recommendations: '[x]'   Implemented  '[]'  Not Implemented (RD to address) '[]'  Not applicable   Previous Goal:  '[x]'   Met  '[]'   Not Met (RD to address)'[]'  Not applicable       Nutrition Risk:  '[]'    High     '[x]'   Moderate    '[]'   Minimal/Uncompromised    ThBabs BertinRD, LDN  EXT [phone]

## 2017-03-04 NOTE — Other (Signed)
Pt remains with productive cough. Still with Thick Tan sputum. He dis sit in the chair today for several hours and has used O2 on and off throughout the day.  He is tolerating his diet. Temp has been controlled with Tylenol as per order.

## 2017-03-04 NOTE — Progress Notes (Signed)
Patient Active Problem List   Diagnosis Code   ??? CAP (community acquired pneumonia) J18.9   ??? HTN (hypertension) I10       Subjective: Pain decreased    Fever persisted    WBC 20 K    Apatite improved    SOB decreased    Physical Examination:  Temp (24hrs), Avg:100.5 ??F (38.1 ??C), Min:98.6 ??F (37 ??C), Max:103.3 ??F (39.6 ??C)      Visit Vitals   ??? BP 100/63 (BP 1 Location: Right arm, BP Patient Position: At rest)   ??? Pulse 76   ??? Temp 98.6 ??F (37 ??C)   ??? Resp 18   ??? Ht 6\' 3"  (1.905 m)   ??? Wt 72.6 kg (160 lb)   ??? SpO2 92%   ??? BMI 20 kg/m2       GENERAL:  In no acute distress, comfortable, lying in bed.  SKIN:   Warm, moist.  Normal skin turgor.  No rash or scratch mark noted.   HEENT:   Normocephalic, Atraumatic,     Pink conjunctiva, Anicteric, EOMI,     No sinus tenderness or stuffy nose and     Clear & moist oral mucosa.  NECK: Supple, no JVD, no thyroidmegammy or lymphadenopathy.  AXILLA: No Lymphadenopathy.  LUNGS:   Symmetrical, Resonant and Deacrease BL base. No wheezes, rales,or rhonchi,  CARDIAC:  S1 & S2, RRR. No murmurs, rubs or gallops heard  ABDOMEN:   Moves with respiration. Soft, NT, BS +.   INGUINAL: No hernia or lymphadenopathy.   GUS:  No supra pubic area tenderness. No CVAT.  EXTREM.:   No edema.   NEURO:   AOX3, No focal deficits..        Labs Reviewed:  Recent Labs      03/04/17   0935  03/03/17   0400   WBC  21.5*  20.4*   HGB  11.0*  11.0*   HCT  31.8*  31.4*   PLT  460*  391     Recent Labs      03/04/17   0935  03/03/17   0400   NA  136  133*   K  3.6  3.7   CL  97*  97*   CO2  27  26   GLU  89  93   BUN  10  7   CREA  1.12  0.86   CA  7.8*  6.9*   MG  2.1  1.5*   PHOS  3.2   --    ALB   --   1.6*   SGOT   --   65*   ALT   --   52     No components found for: GLPOC  No results for input(s): PH, PCO2, PO2, HCO3, FIO2 in the last 72 hours.  No results for input(s): INR in the last 72 hours.    No lab exists for component: INREXT, INREXT    Culture  All Micro Results      Procedure Component Value Units Date/Time    CULTURE, BLOOD [[id number]] Collected:  02/27/17 1830    Order Status:  Completed Specimen:  Blood from Blood Updated:  03/04/17 1017     Special Requests: NO SPECIAL REQUESTS        Culture result: NO GROWTH 5 DAYS       CULTURE, BLOOD [[id number]] Collected:  02/24/17 0430    Order Status:  Completed Specimen:  Whole Blood from  Blood Updated:  03/02/17 1507     Special Requests: NO SPECIAL REQUESTS        Culture result: NO GROWTH 6 DAYS       CULTURE, BLOOD [[id number]]  (Abnormal)  (Susceptibility) Collected:  02/25/17 0900    Order Status:  Completed Specimen:  Whole Blood from Blood Updated:  02/28/17 1152     Special Requests: NO SPECIAL REQUESTS        GRAM STAIN         GRAM POS COCCI IN CLUSTERS AEROBIC BOTTLE CALLED RESULTS TO/VERIFIED READ BACK WITH NURSE TODD GREGORY AT 1255 ON 02/26/17 [id number]     Culture result:         **METHICILLIN RESISTANT STAPHYLOCOCCUS AUREUS** AEROBIC BOTTLE (A)              REMAINING BOTTLE(S) HAS/HAVE NO GROWTH SO FAR    CULTURE, RESPIRATORY/SPUTUM/BRONCH Gay FillerW GRAM STAIN [[id number]]  (Abnormal)  (Susceptibility) Collected:  02/25/17 1930    Order Status:  Completed Specimen:  Sputum from Sputum Updated:  02/27/17 1232     Special Requests: NO SPECIAL REQUESTS        GRAM STAIN         MANY POLYMORPHONUCLEAR LEUKOCYTES (PMNs)              MODERATE GRAM POS COCCI IN CLUSTERS     Culture result:         HEAVY **METHICILLIN RESISTANT STAPHYLOCOCCUS AUREUS** PATIENT IS A KNOWN MRSA (A)              MODERATE ALPHA STREPTOCOCCUS, NOT S. PNEUMONIAE (A)    CULTURE, URINE [[id number]] Collected:  02/24/17 0630    Order Status:  Completed Specimen:  Urine from Foley Specimen Updated:  02/26/17 1237     Special Requests: NO SPECIAL REQUESTS        Culture result: NO GROWTH 48 HOURS             Urine  Color   Date Value Ref Range Status   02/24/2017 YELLOW   Final     Appearance   Date Value Ref Range Status   02/24/2017 CLEAR   Final      Specific gravity   Date Value Ref Range Status   02/24/2017 1.028   Final     pH (UA)   Date Value Ref Range Status   02/24/2017 5.5 5.0 - 7.0   Final     Protein   Date Value Ref Range Status   02/24/2017 100 (A) NEG mg/dL Final     Ketone   Date Value Ref Range Status   02/24/2017 TRACE (A) NEG mg/dL Final     Bilirubin   Date Value Ref Range Status   02/24/2017 NEGATIVE  NEG   Final     Blood   Date Value Ref Range Status   02/24/2017 MODERATE (A) NEG   Final     Urobilinogen   Date Value Ref Range Status   02/24/2017 1.0 0.2 - 1.0 EU/dL Final     Nitrites   Date Value Ref Range Status   02/24/2017 NEGATIVE  NEG   Final     Leukocyte Esterase   Date Value Ref Range Status   02/24/2017 NEGATIVE  NEG   Final       Radiology Studies:  Xr Chest Pa Lat    Result Date: 03/03/2017  EXAM:  XR CHEST PA LAT dated 03/03/2017 8:12 AM INDICATION:   BL pneumonia with pain,  follow up COMPARISON: 02/26/2017. FINDINGS: PA and lateral radiographs of the chest.  There are persistent bilateral lower lobe infiltrates. There are bilateral pleural effusions.     IMPRESSION: There are persistent bilateral lower lobe infiltrates. There are bilateral pleural effusions.     Xr Chest Pa Lat    Result Date: 02/26/2017  EXAM:  XR CHEST PA LAT dated 02/26/2017 10:31 AM INDICATION:   Persistent fever suspect empyema or abscess. COMPARISON: Chest AP semierect film from 02/24/2017.Chest CT scan 02/25/17. PA and lateral radiographs of the chest.  Dense infiltrate or consolidation in the posterior right midlung adjacent to the fissure in the superior segment right lower lobe measuring approximately 8.6 x 7.3 x 7.3 cm has  increased since previous examination 02/24/17.  Infiltrate left lower lobe has increased.  Moderate consolidation left lower lobe again noted Lungs are normally inflated.  Normal heart size..  Right hilar prominence compatible with small nodes unchanged. Marland Kitchen     IMPRESSION: Increasing consolidation posterior right midlung and  increasing infiltrate left lower lobe since previous examination 02/24/17.  Findings are unchanged since CT scan of 02/25/17. Dense consolidation left lower lobe measuring 4.9 cm in diameter, focal consolidation vs. Beginning of an abscess. Close follow-up examination suggested.  Some distention of colon with air unchanged.     Xr Spine Lumb Min 4 V    Result Date: 02/25/2017  EXAM:  XR SPINE LUMB MIN 4 V dated 02/25/2017 12:28 PM INDICATION:   LBP COMPARISON:  None. FINDINGS:  AP, lateral, and oblique views of the lumbar spine were obtained. No fracture or subluxation. No disc space narrowing. No arthritic changes. No osteolytic or osteoblastic lesion. SI joints are unremarkable. There is opacification of both renal collecting system and ureters from the intravenous contrast injection of the prior CT.     IMPRESSION:   Normal exam.     Ct Chest Wo Cont    Result Date: 02/28/2017  EXAM:  CT CHEST WO CONT  dated 02/28/2017 2:38 PM INDICATION:  MRSA pneumonia worsening suspect abscess formation COMPARISON: CT scan the chest with contrast injection 02/25/17.  CONTRAST:  None. TECHNIQUE: Spiral CT through the thorax without contrast was performed. 2.5 mm axial images with sagittal and coronal reconstructions produced. CT dose reduction was achieved through use of a standardized protocol tailored for this examination and automatic exposure control for dose modulation. The absence of intravenous contrast prevents evaluation for pulmonary emboli or aortic dissection. FINDINGS:Small bulla in the left and right upper lobes.  Focal infiltrate in the anterior part of the left upper lobe unchanged since the previous examination. Bilateral basilar consolidation again noted.  On the right side it measures approximately 10 cm in craniocaudal dimension unchanged since the previous examination.  On the left side it measures approximately 10 cm , increasing from approximately  9 cm on the previous study.  No significant pleural effusion or empyema detected at this time.  No cavitary lesions other than small bulla within the lung fields.\\ 7.1 mm anterior inferior pericardial effusion again noted.  Right hemidiaphragm elevation.  Heart size is normal.     IMPRESSION: Bibasilar consolidation in the lung fields left side slightly larger again noted with small infiltrate left upper lobe and bullous changes the left and right lung apices. No cavitary lesion or loculated pleural effusion detected on the current examination.  Trace pericardial effusion measuring 7.1 mm appearing since previous examination.  Right hemidiaphragm elevation.     Ct Chest W Cont  Result Date: 02/25/2017  CT CHEST W CONT dated 02/25/2017 12:19 PM INDICATION:  Pneumonia, Hematemesis COMPARISON: None TECHNIQUE: Spiral CT with IV contrast bolus through the thorax was performed. 2.5 mm axial images with sagittal and coronal reconstructions produced. Contrast material 100 mL Isovue 300. One or more of the following dose reduction techniques were used: automated exposure control, adjustment of the mA and/or kV according to patient size, use of iterative reconstruction technique. INTERPRETATION:  There is less than optimal opacification of the pulmonary arterial tree. No definite pulmonary emboli can be seen. The aorta is unremarkable. There are dense consolidative infiltrates in both lower lobes, and there is patchy infiltration in the left upper lobe. No definite pleural effusion is seen. Multiple air bronchograms are seen in the lower lobes. No hilar or mediastinal mass is identified. The tracheobronchial tree is unremarkable. The thyroid is unremarkable. No cervical lymphadenopathy is seen. No chest wall abnormality is observed. Limited images the upper abdomen show no significant abnormality. The visualized skeletal structures are unremarkable.     IMPRESSION: 1.  Dense bilateral lower lobe infiltrates consistent with  pneumonia. There is also patchy infiltration in the left upper lobe. 2.  Suboptimal opacification of the pulmonary arterial tree. No gross pulmonary embolic disease is observed.    Xr Chest Port    Result Date: 02/24/2017  EXAM:  XR CHEST PORT dated 02/24/2017 8:31 AM INDICATION:  pre op. Nurses note states patient coughing up blood-tinged sputum. COMPARISON:  None. FINDINGS: A portable AP semierect radiograph of the chest was obtained at 0819 hours. There are bilateral lower lung zone infiltrates slightly more prominent on the left than the right. There is no obscuration of the diaphragm or of the heart border, and localization of the infiltrates is difficult. Cannot rule out overlying pleural disease. The upper lung zones are clear. Heart size is normal. No hilar or mediastinal mass.     IMPRESSION:  Bilateral lower lung zone densities consistent with bilateral infiltrates. Consider conventional PA and lateral views for better characterization.     Assessment: Same    Today's lab was noted.    Plan:  Discontinue Vanco    Add Linezolid IV and PO Clindamycin    Monitor culture, fluid and electrolyte.    Follow ECHO result     `

## 2017-03-04 NOTE — Progress Notes (Signed)
PT TEMP 103.3, PULSE 119. S/W DR. Dwyane DeeATNAFU. 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 NO NEW ORDER GIVEN, PT DID RECEIVE TYLENOL.  TEMP AT 0450 99.0 WITH A PULSE OF 90. PT REMAINS ALERT AND ORIENTED X3. ON DROPLET ISOLATION.

## 2017-03-04 NOTE — Other (Signed)
Reason for Admission: Fever  Abdominal Pain  Influenza      Problem List:   Hospital Problems  Date Reviewed: 02/23/2017          Codes Class Noted POA    HTN (hypertension) ICD-10-CM: I10  ICD-9-CM: 401.9  03/02/2017 Yes        * (Principal)CAP (community acquired pneumonia) ICD-10-CM: J18.9  ICD-9-CM: 486  02/23/2017 Yes              Background:    Past Medical History: No past medical history on file.      Pain Assessment:  Pain Intensity 1: 3 (03/04/17 1820)  Pain Location 1: Back   Pain Intervention(s) 1: Medication (see MAR)    Patient Stated Pain Goal: 2            Time of last Intervention: 3 hours  Effectiveness of intervention: some relief   Other Actions to relieve pain: Distraction             Summary of Shift :    Bedside and Verbal shift change report given to Anisse (oncoming nurse) by Revonda StandardAllison (offgoing nurse). Report included the following information SBAR, Kardex, Intake/Output, MAR, Accordion, Recent Results and Med Rec Status.

## 2017-03-04 NOTE — Progress Notes (Signed)
Problem: Nutrition Deficit  Goal: *Optimize nutritional status  Outcome: Progressing Towards Goal  Pt currently consuming +/-75% of meals and supplements at this time.

## 2017-03-04 NOTE — Progress Notes (Signed)
Consult for Vancomycin Dosing by Pharmacy by Dr. 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   BUN Lab Results   Component Value Date/Time    BUN 7 03/03/2017 04:00 AM       WBC Lab Results   Component Value Date/Time    WBC PENDING 03/04/2017 09:35 AM       H/H Lab Results   Component Value Date/Time    HGB 11.0 (L) 03/04/2017 09:35 AM      Platelets Lab Results   Component Value Date/Time    PLATELET 460 (H) 03/04/2017 09:35 AM      Temp Temp: 99.7 ??F (37.6 ??C)     Dose administration notes:   Doses given appropriately as scheduled  Plan for level:  Not Now  Adjustments:  None  Plan:  Continue to monitor

## 2017-03-05 LAB — CBC WITH AUTOMATED DIFF
ABS. BASOPHILS: 0 10*3/uL (ref 0.0–0.2)
ABS. EOSINOPHILS: 0 10*3/uL (ref 0.0–0.7)
ABS. LYMPHOCYTES: 1.1 10*3/uL — ABNORMAL LOW (ref 1.2–3.4)
ABS. MONOCYTES: 0.9 10*3/uL — ABNORMAL LOW (ref 1.1–3.2)
ABS. NEUTROPHILS: 12.4 10*3/uL — ABNORMAL HIGH (ref 1.4–6.5)
BASOPHILS: 0 % (ref 0–2)
EOSINOPHILS: 0 % (ref 0–5)
HCT: 27.1 % — ABNORMAL LOW (ref 36.8–45.2)
HGB: 9.4 g/dL — ABNORMAL LOW (ref 12.8–15.0)
IMMATURE GRANULOCYTES: 1 % (ref 0.0–5.0)
LYMPHOCYTES: 8 % — ABNORMAL LOW (ref 16–40)
MCH: 28.6 PG (ref 27–31)
MCHC: 34.7 g/dL (ref 32–36)
MCV: 82.4 FL (ref 81–99)
MONOCYTES: 6 % (ref 0–12)
MPV: 9.2 FL (ref 7.4–10.4)
NEUTROPHILS: 86 % — ABNORMAL HIGH (ref 40–70)
PLATELET: 415 10*3/uL (ref 140–450)
RBC: 3.29 M/uL — ABNORMAL LOW (ref 4.0–5.2)
RDW: 13.2 % (ref 11.5–14.5)
WBC: 14.5 10*3/uL — ABNORMAL HIGH (ref 4.8–10.8)

## 2017-03-05 LAB — METABOLIC PANEL, BASIC
Anion gap: 7 mmol/L — ABNORMAL LOW (ref 10–17)
BUN/Creatinine ratio: 10 (ref 6.0–20.0)
BUN: 9 MG/DL (ref 7–18)
CO2: 32 mmol/L (ref 21–32)
Calcium: 7.1 MG/DL — ABNORMAL LOW (ref 8.5–10.1)
Chloride: 99 mmol/L (ref 98–107)
Creatinine: 0.93 MG/DL (ref 0.6–1.3)
GFR est AA: >60 mL/min/{1.73_m2} (ref >60)
GFR est non-AA: >60 mL/min/{1.73_m2} (ref >60)
Glucose: 112 mg/dL — ABNORMAL HIGH (ref 74–106)
Potassium: 3.6 mmol/L (ref 3.50–5.10)
Sodium: 134 mmol/L — ABNORMAL LOW (ref 136–145)

## 2017-03-05 LAB — VANCOMYCIN, TROUGH: Vancomycin,trough: 17.5 ug/mL (ref 15–20)

## 2017-03-05 MED FILL — ZYVOX 600 MG/300 ML INTRAVENOUS PIGGYBACK: 600 mg/300 mL | INTRAVENOUS | Qty: 300

## 2017-03-05 MED FILL — OXYCODONE 10 MG TAB: 10 mg | ORAL | Qty: 2

## 2017-03-05 MED FILL — ENOXAPARIN 40 MG/0.4 ML SUB-Q SYRINGE: 40 mg/0.4 mL | SUBCUTANEOUS | Qty: 0.4

## 2017-03-05 MED FILL — FAMOTIDINE 20 MG TAB: 20 mg | ORAL | Qty: 1

## 2017-03-05 MED FILL — SENNA LAX 8.6 MG TABLET: 8.6 mg | ORAL | Qty: 1

## 2017-03-05 MED FILL — CLINDAMYCIN 150 MG CAP: 150 mg | ORAL | Qty: 2

## 2017-03-05 MED FILL — OXYCODONE-ACETAMINOPHEN 5 MG-325 MG TAB: 5-325 mg | ORAL | Qty: 2

## 2017-03-05 MED FILL — IPRATROPIUM-ALBUTEROL 2.5 MG-0.5 MG/3 ML NEB SOLUTION: 2.5 mg-0.5 mg/3 ml | RESPIRATORY_TRACT | Qty: 3

## 2017-03-05 MED FILL — DEXTROSE 5% IN NORMAL SALINE IV: INTRAVENOUS | Qty: 1000

## 2017-03-05 MED FILL — CLONIDINE 0.1 MG TAB: 0.1 mg | ORAL | Qty: 1

## 2017-03-05 NOTE — Other (Signed)
Reason for Admission: Fever  Abdominal Pain  Influenza      Problem List:   Hospital Problems  Date Reviewed: 02/23/2017          Codes Class Noted POA    HTN (hypertension) ICD-10-CM: I10  ICD-9-CM: 401.9  03/02/2017 Yes        * (Principal)CAP (community acquired pneumonia) ICD-10-CM: J18.9  ICD-9-CM: 486  02/23/2017 Yes              Background:    Past Medical History: No past medical history on file.                   Pain Assessment:  Pain Intensity 1: 5 (03/05/17 1806)  Pain Location 1: Back   Pain Intervention(s) 1: Medication (see MAR)    Patient Stated Pain Goal: 3            Time of last Intervention: 3 hours  Effectiveness of intervention: some relief   Other Actions to relieve pain: Distraction             Summary of Shift :   Bedside and Verbal shift change report given to Hilda LiasMarie (Cabin crewoncoming nurse) by Revonda StandardAllison (offgoing nurse). Report included the following information SBAR, Kardex, Intake/Output, MAR, Accordion, Recent Results and Med Rec Status.

## 2017-03-05 NOTE — Progress Notes (Signed)
Problem: Breathing Pattern - Ineffective  Goal: *Use of effective breathing techniques  Outcome: Progressing Towards Goal  Pt was seen laying in bed. No distress noted. Pt tolerated treatment well. Spo2-100% on 2lnc.

## 2017-03-05 NOTE — Other (Signed)
Assumed care for this patient initially treated for the flu which has been complicated by necrotizing pneumonia.  Receiving IV antibiotics and hydration iv.

## 2017-03-05 NOTE — Other (Signed)
Verbal shift change report given to Allison RN (oncoming nurse) by Anisse RN (offgoing nurse). Report included the following information SBAR and Kardex.

## 2017-03-05 NOTE — Other (Signed)
Pt showered today and states he is feeling better today. He remains with a productive cough and is able to pull 1000 on the IS as opposed to 200 yesterday.

## 2017-03-05 NOTE — Progress Notes (Signed)
Patient Active Problem List   Diagnosis Code   ??? CAP (community acquired pneumonia) J18.9   ??? HTN (hypertension) I10       Subjective: Fells better    WBC decreased to 14 K from 21 K    Physical Examination:  Temp (24hrs), Avg:99.8 ??F (37.7 ??C), Min:97.5 ??F (36.4 ??C), Max:102.5 ??F (39.2 ??C)      Visit Vitals   ??? BP 113/78 (BP 1 Location: Left arm, BP Patient Position: At rest)   ??? Pulse 81   ??? Temp 99.3 ??F (37.4 ??C)   ??? Resp 18   ??? Ht 6\' 3"  (1.905 m)   ??? Wt 72.6 kg (160 lb)   ??? SpO2 99%   ??? BMI 20 kg/m2       GENERAL:  In no acute distress, comfortable, lying in bed.  SKIN:   Warm, moist.  Normal skin turgor.  No rash or scratch mark noted.   HEENT:   Normocephalic, Atraumatic,     Pink conjunctiva, Anicteric, EOMI,     No sinus tenderness or stuffy nose and     Clear & moist oral mucosa.  NECK: Supple, no JVD, no thyroidmegammy or lymphadenopathy.  AXILLA: No Lymphadenopathy.  LUNGS:   Symmetrical, Resonant and CTA BL. No wheezes, rales,or rhonchi,  CARDIAC:  S1 & S2, RRR. No murmurs, rubs or gallops heard  ABDOMEN:   Moves with respiration. Soft, NT, BS +.   INGUINAL: No hernia or lymphadenopathy.   GUS:  No supra pubic area tenderness. No CVAT.  EXTREM.:   No edema.   NEURO:   AOX3, No focal deficits..        Labs Reviewed:  Recent Labs      03/06/17   0600  03/05/17   1345  03/04/17   0935   WBC  10.3  14.5*  21.5*   HGB  9.4*  9.4*  11.0*   HCT  27.5*  27.1*  31.8*   PLT  424  415  460*     Recent Labs      03/06/17   0600  03/05/17   1345  03/04/17   0935   NA  137  134*  136   K  3.7  3.6  3.6   CL  99  99  97*   CO2  27  32  27   GLU  106  112*  89   BUN  7  9  10    CREA  0.97  0.93  1.12   CA  7.4*  7.1*  7.8*   MG  1.9   --   2.1   PHOS   --    --   3.2   ALB  1.5*   --    --    SGOT  35   --    --    ALT  43   --    --      No components found for: GLPOC  No results for input(s): PH, PCO2, PO2, HCO3, FIO2 in the last 72 hours.  No results for input(s): INR in the last 72 hours.     No lab exists for component: INREXT, Marval Regal    Culture  All Micro Results     Procedure Component Value Units Date/Time    CULTURE, BLOOD [[id number]] Collected:  02/27/17 1830    Order Status:  Completed Specimen:  Blood from Blood Updated:  03/04/17 1017  Special Requests: NO SPECIAL REQUESTS        Culture result: NO GROWTH 5 DAYS       CULTURE, BLOOD [[id number]] Collected:  02/24/17 0430    Order Status:  Completed Specimen:  Whole Blood from Blood Updated:  03/02/17 1507     Special Requests: NO SPECIAL REQUESTS        Culture result: NO GROWTH 6 DAYS       CULTURE, BLOOD [[id number]]  (Abnormal)  (Susceptibility) Collected:  02/25/17 0900    Order Status:  Completed Specimen:  Whole Blood from Blood Updated:  02/28/17 1152     Special Requests: NO SPECIAL REQUESTS        GRAM STAIN         GRAM POS COCCI IN CLUSTERS AEROBIC BOTTLE CALLED RESULTS TO/VERIFIED READ BACK WITH NURSE TODD GREGORY AT 1255 ON 02/26/17 [id number]     Culture result:         **METHICILLIN RESISTANT STAPHYLOCOCCUS AUREUS** AEROBIC BOTTLE (A)              REMAINING BOTTLE(S) HAS/HAVE NO GROWTH SO FAR    CULTURE, RESPIRATORY/SPUTUM/BRONCH Gay Filler STAIN [[id number]]  (Abnormal)  (Susceptibility) Collected:  02/25/17 1930    Order Status:  Completed Specimen:  Sputum from Sputum Updated:  02/27/17 1232     Special Requests: NO SPECIAL REQUESTS        GRAM STAIN         MANY POLYMORPHONUCLEAR LEUKOCYTES (PMNs)              MODERATE GRAM POS COCCI IN CLUSTERS     Culture result:         HEAVY **METHICILLIN RESISTANT STAPHYLOCOCCUS AUREUS** PATIENT IS A KNOWN MRSA (A)              MODERATE ALPHA STREPTOCOCCUS, NOT S. PNEUMONIAE (A)    CULTURE, URINE [[id number]] Collected:  02/24/17 0630    Order Status:  Completed Specimen:  Urine from Foley Specimen Updated:  02/26/17 1237     Special Requests: NO SPECIAL REQUESTS        Culture result: NO GROWTH 48 HOURS             Urine  Color   Date Value Ref Range Status   02/24/2017 YELLOW   Final      Appearance   Date Value Ref Range Status   02/24/2017 CLEAR   Final     Specific gravity   Date Value Ref Range Status   02/24/2017 1.028   Final     pH (UA)   Date Value Ref Range Status   02/24/2017 5.5 5.0 - 7.0   Final     Protein   Date Value Ref Range Status   02/24/2017 100 (A) NEG mg/dL Final     Ketone   Date Value Ref Range Status   02/24/2017 TRACE (A) NEG mg/dL Final     Bilirubin   Date Value Ref Range Status   02/24/2017 NEGATIVE  NEG   Final     Blood   Date Value Ref Range Status   02/24/2017 MODERATE (A) NEG   Final     Urobilinogen   Date Value Ref Range Status   02/24/2017 1.0 0.2 - 1.0 EU/dL Final     Nitrites   Date Value Ref Range Status   02/24/2017 NEGATIVE  NEG   Final     Leukocyte Esterase   Date Value Ref Range Status   02/24/2017  NEGATIVE  NEG   Final       Radiology Studies:  Xr Chest Pa Lat    Result Date: 03/03/2017  EXAM:  XR CHEST PA LAT dated 03/03/2017 8:12 AM INDICATION:   BL pneumonia with pain, follow up COMPARISON: 02/26/2017. FINDINGS: PA and lateral radiographs of the chest.  There are persistent bilateral lower lobe infiltrates. There are bilateral pleural effusions.     IMPRESSION: There are persistent bilateral lower lobe infiltrates. There are bilateral pleural effusions.     Xr Chest Pa Lat    Result Date: 02/26/2017  EXAM:  XR CHEST PA LAT dated 02/26/2017 10:31 AM INDICATION:   Persistent fever suspect empyema or abscess. COMPARISON: Chest AP semierect film from 02/24/2017.Chest CT scan 02/25/17. PA and lateral radiographs of the chest.  Dense infiltrate or consolidation in the posterior right midlung adjacent to the fissure in the superior segment right lower lobe measuring approximately 8.6 x 7.3 x 7.3 cm has  increased since previous examination 02/24/17.  Infiltrate left lower lobe has increased.  Moderate consolidation left lower lobe again noted Lungs are normally inflated.  Normal heart size..  Right hilar prominence compatible with small nodes unchanged. Marland Kitchen      IMPRESSION: Increasing consolidation posterior right midlung and increasing infiltrate left lower lobe since previous examination 02/24/17.  Findings are unchanged since CT scan of 02/25/17. Dense consolidation left lower lobe measuring 4.9 cm in diameter, focal consolidation vs. Beginning of an abscess. Close follow-up examination suggested.  Some distention of colon with air unchanged.     Xr Spine Lumb Min 4 V    Result Date: 02/25/2017  EXAM:  XR SPINE LUMB MIN 4 V dated 02/25/2017 12:28 PM INDICATION:   LBP COMPARISON:  None. FINDINGS:  AP, lateral, and oblique views of the lumbar spine were obtained. No fracture or subluxation. No disc space narrowing. No arthritic changes. No osteolytic or osteoblastic lesion. SI joints are unremarkable. There is opacification of both renal collecting system and ureters from the intravenous contrast injection of the prior CT.     IMPRESSION:   Normal exam.     Ct Chest Wo Cont    Result Date: 02/28/2017  EXAM:  CT CHEST WO CONT  dated 02/28/2017 2:38 PM INDICATION:  MRSA pneumonia worsening suspect abscess formation COMPARISON: CT scan the chest with contrast injection 02/25/17.  CONTRAST:  None. TECHNIQUE: Spiral CT through the thorax without contrast was performed. 2.5 mm axial images with sagittal and coronal reconstructions produced. CT dose reduction was achieved through use of a standardized protocol tailored for this examination and automatic exposure control for dose modulation. The absence of intravenous contrast prevents evaluation for pulmonary emboli or aortic dissection. FINDINGS:Small bulla in the left and right upper lobes.  Focal infiltrate in the anterior part of the left upper lobe unchanged since the previous examination. Bilateral basilar consolidation again noted.  On the right side it measures approximately 10 cm in craniocaudal dimension unchanged since the previous examination.  On the left side it measures approximately 10 cm , increasing from approximately  9 cm on the previous study.  No significant pleural effusion or empyema detected at this time.  No cavitary lesions other than small bulla within the lung fields.\\ 7.1 mm anterior inferior pericardial effusion again noted.  Right hemidiaphragm elevation.  Heart size is normal.     IMPRESSION: Bibasilar consolidation in the lung fields left side slightly larger again noted with small infiltrate left upper lobe and bullous changes the  left and right lung apices. No cavitary lesion or loculated pleural effusion detected on the current examination.  Trace pericardial effusion measuring 7.1 mm appearing since previous examination.  Right hemidiaphragm elevation.     Ct Chest W Cont    Result Date: 02/25/2017  CT CHEST W CONT dated 02/25/2017 12:19 PM INDICATION:  Pneumonia, Hematemesis COMPARISON: None TECHNIQUE: Spiral CT with IV contrast bolus through the thorax was performed. 2.5 mm axial images with sagittal and coronal reconstructions produced. Contrast material 100 mL Isovue 300. One or more of the following dose reduction techniques were used: automated exposure control, adjustment of the mA and/or kV according to patient size, use of iterative reconstruction technique. INTERPRETATION:  There is less than optimal opacification of the pulmonary arterial tree. No definite pulmonary emboli can be seen. The aorta is unremarkable. There are dense consolidative infiltrates in both lower lobes, and there is patchy infiltration in the left upper lobe. No definite pleural effusion is seen. Multiple air bronchograms are seen in the lower lobes. No hilar or mediastinal mass is identified. The tracheobronchial tree is unremarkable. The thyroid is unremarkable. No cervical lymphadenopathy is seen. No chest wall abnormality is observed. Limited images the upper abdomen show no significant abnormality. The visualized skeletal structures are unremarkable.     IMPRESSION: 1.  Dense bilateral lower lobe infiltrates consistent with  pneumonia. There is also patchy infiltration in the left upper lobe. 2.  Suboptimal opacification of the pulmonary arterial tree. No gross pulmonary embolic disease is observed.    Xr Chest Port    Result Date: 02/24/2017  EXAM:  XR CHEST PORT dated 02/24/2017 8:31 AM INDICATION:  pre op. Nurses note states patient coughing up blood-tinged sputum. COMPARISON:  None. FINDINGS: A portable AP semierect radiograph of the chest was obtained at 0819 hours. There are bilateral lower lung zone infiltrates slightly more prominent on the left than the right. There is no obscuration of the diaphragm or of the heart border, and localization of the infiltrates is difficult. Cannot rule out overlying pleural disease. The upper lung zones are clear. Heart size is normal. No hilar or mediastinal mass.     IMPRESSION:  Bilateral lower lung zone densities consistent with bilateral infiltrates. Consider conventional PA and lateral views for better characterization.     Assessment: Improving    Today's lab was noted.    Plan:  Continue current meds    Monitor culture, fluid and electrolyte.   `

## 2017-03-05 NOTE — Progress Notes (Addendum)
Vascular Access Team:  Received request for PICC placement.  Patient is on zyvox a non irritant and a non vesicant drug. For now, patient doesn't meet criteria for PICC insertion. Vein preservation is a must for future need. Vascular access team will remain available if needed.

## 2017-03-05 NOTE — Other (Signed)
Spok with [facility]/PICC Team.  He states pt does not meet criteria for PICC. He was advised to speak with Dr. Dwyane DeeAtnafu.

## 2017-03-05 NOTE — Progress Notes (Addendum)
PICC Placement Note    PRE-PROCEDURE VERIFICATION  Correct Procedure: Yes  Correct Site:?? Yes  Vitals :  Visit Vitals   ??? BP 124/79 (BP 1 Location: Right arm, BP Patient Position: At rest)   ??? Pulse 93   ??? Temp 98.9 ??F (37.2 ??C)   ??? Resp 20   ??? Ht 6\' 3"  (1.905 m)   ??? Wt 72.6 kg (160 lb)   ??? SpO2 93%   ??? BMI 20 kg/m2         Recent Labs  Recent Labs      03/04/17   0935   WBC  21.5*   HGB  11.0*   HCT  31.8*     Recent Labs      03/04/17   0935   BUN  10   CREA  1.12       Allergies: NKDA    Education materials for PICC Care given to family :No. See Patient Education activity for further details.  PICC Booklet placed on chart: Yes    PROCEDURE DETAIL  START TIME:1400  STOP TIME:  1410  Utilizing realtime ultrasound guidance.A single lumen  PICC line was started for vascular access. The following documentation is in addition to the PICC properties in the lines/airways flowsheet :  Lot #:[id number]  xylocaine used: Yes,0.5 ml  Mid-Arm Circumference: 29 (cm)  Internal Catheter Length: 44 (cm)  External Catheter Length: 0 (cm)  Total Catheter Length: 44 (cm)  Vein Selection for PICC:R.Brachial  Central Line Bundle followed Yes  Complication Related to Insertion:None  ??  The Tip of this Central Line has been verified as terminating near the  Junction of the SVC and Right Atrium using ECG guidance     Line is okay to use: Yes

## 2017-03-05 NOTE — Other (Signed)
Utilization Review         ?? Pneumonia, Community Acquired - Care Day 10 (03/04/2017) by Stephenie Acres  ??   ?? Review Status Review Entered ??   ?? Completed 03/05/2017 ??   ?? Details ??   ??  ??   ?? Care Day: 10 Care Date: 03/04/2017 Level of Care: Telemetry ??   ?? Guideline Day 2  ??   ?? Clinical Status ??   ?? (X) * No CO2 retention or acidosis ??   ?? (X) * No requirement for mechanical ventilation ??   ?? (X) * Hypotension absent ??   ?? 03/05/2017 8:52 AM EST by Stephenie Acres ??   ?? ?? BP 109/67, P 84, R 20, ??   ??  ??   ?? ( ) * Fever absent or reduced ??   ?? 03/05/2017 8:52 AM EST by Stephenie Acres ??   ?? ?? T 99.7-100.9 ??   ??  ??   ?? (X) * No hypoxia on room air or oxygenation improved ??   ?? 03/05/2017 8:52 AM EST by Stephenie Acres ??   ?? ?? , 02 SAT 93% RA ??   ??  ??   ?? (X) * Mental status improved or at baseline ??   ??  ??   ?? Activity ??   ?? (X) * Increased activity ??   ??  ??   ?? Routes ??   ?? ( ) Oral hydration, medications ??   ?? 03/05/2017 8:52 AM EST by Stephenie Acres ??   ?? ?? TYLENOL PO Q 6 HRS PRN X2, DUO NEBS Q 6 HRS, CATAPRES PO Q 8 HRS, IV FLS 100 ML/HR, LOVENOX SC Q 24 HRS, PEPCID PO Q 12 HRS, ZOFRAN 4 MG IV Q 6 HRS PRN X1 (2X/24 HRS), ROXICODONE PO Q 4 HRS PRN X 4 (9X/24 HRS), PERCOCET 2 TABS PO Q 4 HRS PRN X 2, ??   ??  ??   ?? (X) Usual diet ??   ?? 03/05/2017 8:52 AM EST by Stephenie Acres ??   ?? ?? REG LACTOSE FREE DIET, ??   ??  ??   ??  ??   ?? Interventions ??   ?? (X) Possible oxygen ??   ?? 03/05/2017 8:52 AM EST by Stephenie Acres ??   ?? ?? 2L PRN ??   ??  ??   ??  ??   ?? Medications ??   ?? (X) IV or oral antibiotics ??   ?? 03/05/2017 8:52 AM EST by Stephenie Acres ??   ?? ?? ZOSYN IV Q 8 HRS (given 2x then dc????????d), VANCOMYCIN IV DC????????D, CLEOCIN PO Q 6 HRS, ZYVOX IV Q 12 HRS, ??   ??  ??   ??  ??   ?? 03/05/2017 8:53 AM EST by Stephenie Acres ??   ?? Subject: Additional Clinical Information ??   ?? PROGRESS NOTE:Pain decreased. Fever persisted. WBC 20 K. ?? Appetite improved. SOB decreased ??WBC 21.5, RBC 3.85, HGB 11.0, HCT 31.8, PLT 460,  EXAM: ?? LUNGS: Symmetrical, Resonant and [place] BL base. No wheezes, rales,or rhonchi,Assessment: ?? Same. Today's lab was noted. ??Plan: Discontinue Vanco. Add Linezolid IV and PO Clindamycin. ?? Monitor culture, fluid and electrolyte. Follow ECHO result ?? ??   ??    ??   ??  ??   ??  ??   ??  ??   ??  ??   ??  ??   ?? * Milestone ??   ??  ??   ??   ??  Pneumonia, Community Acquired - Care Day 9 (03/03/2017) by Stephenie Acres  ??   ?? Review Status Review Entered ??   ?? Completed 03/05/2017 ??   ?? Details ??   ??  ??   ?? Care Day: 9 Care Date: 03/03/2017 Level of Care: Telemetry ??   ?? Guideline Day 2  ??   ?? Clinical Status ??   ?? (X) * No CO2 retention or acidosis ??   ?? (X) * No requirement for mechanical ventilation ??   ?? (X) * Hypotension absent ??   ?? 03/05/2017 8:41 AM EST by Stephenie Acres ??   ?? ?? BP 104/69, P87-119, R 18, ??   ??  ??   ?? ( ) * Fever absent or reduced ??   ?? 03/05/2017 8:41 AM EST by Stephenie Acres ??   ?? ?? T 99-103.3 ??   ??  ??   ?? (X) * No hypoxia on room air or oxygenation improved ??   ?? (X) * Mental status improved or at baseline ??   ??  ??   ?? Activity ??   ?? (X) * Increased activity ??   ??  ??   ?? Routes ??   ?? (X) Usual diet ??   ??  ??   ?? Interventions ??   ?? (X) Possible oxygen ??   ?? 03/05/2017 8:41 AM EST by Stephenie Acres ??   ?? ?? 02 SAT 95% RA 02 2-3L NC (02 SAT 98% 3L NC) ??   ??  ??   ??  ??   ?? Medications ??   ?? (X) IV or oral antibiotics ??   ?? 03/05/2017 8:41 AM EST by Stephenie Acres ??   ?? ?? ZOSYN IV Q 8 HRS, VANCOMYCIN IV Q 8 HRS, ??   ??  ??   ??  ??   ?? 03/05/2017 8:42 AM EST by Stephenie Acres ??   ?? Subject: Additional Clinical Information ??   ?? PROGRESS NOTE: ??Continue to have fever. Pain well controlled with the new regimen. ?? ?? Still has on and off SOB.WBC 20.4, RBC 3.84, HGB 11.0, HCT 31.4, NA 133, CL 97, CA 6.9, MAG 1.5, T BILI 2.1, TP 5.1, ALB 1.6, AST 65, ALK PHOS 170CXR: There are persistent bilateral lower lobe infiltrates. There are bilateral pleural effusions.Assessment: Same.  Today's lab was noted. ??Plan: ?? ?? Continue current meds. ?? Monitor culture, fluid and electrolyte. PA and [place] chest x-ray ?? ?? ?? ?? ?? ?? ?? ?? ?? ?? ?? ?? ?? ?? ?? ?? ?? ?? ?? ?? ?? ` ?? REG DIET, TYLENOL PO Q 6 HRS PRN X2, DUO NEBS Q 6 HRS, CATAPRES PO Q 8 HRS, IV FLS 100 ML/HR, LOVENOX SC Q 24 HRS, ZOFRAN 4 MG IV Q 6 HRS PRN X1, ROXICODONE PO Q 4 HRS PRN X 5, ??   ??    ??   ??  ??   ??  ??   ??  ??   ??  ??   ??  ??   ?? * Milestone ??   ??

## 2017-03-06 LAB — METABOLIC PANEL, COMPREHENSIVE
A-G Ratio: 0.4 — ABNORMAL LOW (ref 1.0–3.1)
ALT (SGPT): 43 U/L (ref 12.0–78.0)
AST (SGOT): 35 U/L (ref 15–37)
Albumin: 1.5 g/dL — ABNORMAL LOW (ref 3.40–5.00)
Alk. phosphatase: 127 U/L — ABNORMAL HIGH (ref 46–116)
Anion gap: 15 mmol/L (ref 10–17)
BUN/Creatinine ratio: 7 (ref 6.0–20.0)
BUN: 7 MG/DL (ref 7–18)
Bilirubin, total: 1 MG/DL (ref 0.20–1.00)
CO2: 27 mmol/L (ref 21–32)
Calcium: 7.4 MG/DL — ABNORMAL LOW (ref 8.5–10.1)
Chloride: 99 mmol/L (ref 98–107)
Creatinine: 0.97 MG/DL (ref 0.6–1.3)
GFR est AA: >60 mL/min/{1.73_m2} (ref >60)
GFR est non-AA: >60 mL/min/{1.73_m2} (ref >60)
Globulin: 4 g/dL
Glucose: 106 mg/dL (ref 74–106)
Potassium: 3.7 mmol/L (ref 3.50–5.10)
Protein, total: 5.5 g/dL — ABNORMAL LOW (ref 6.40–8.20)
Sodium: 137 mmol/L (ref 136–145)

## 2017-03-06 LAB — CBC WITH AUTOMATED DIFF
ABS. BASOPHILS: 0 10*3/uL (ref 0.0–0.2)
ABS. EOSINOPHILS: 0.1 10*3/uL (ref 0.0–0.7)
ABS. LYMPHOCYTES: 1 10*3/uL — ABNORMAL LOW (ref 1.2–3.4)
ABS. MONOCYTES: 0.8 10*3/uL — ABNORMAL LOW (ref 1.1–3.2)
ABS. NEUTROPHILS: 8.4 10*3/uL — ABNORMAL HIGH (ref 1.4–6.5)
BASOPHILS: 0 % (ref 0–2)
EOSINOPHILS: 1 % (ref 0–5)
HCT: 27.5 % — ABNORMAL LOW (ref 36.8–45.2)
HGB: 9.4 g/dL — ABNORMAL LOW (ref 12.8–15.0)
IMMATURE GRANULOCYTES: 1 % (ref 0.0–5.0)
LYMPHOCYTES: 9 % — ABNORMAL LOW (ref 16–40)
MCH: 28.2 PG (ref 27–31)
MCHC: 34.2 g/dL (ref 32–36)
MCV: 82.6 FL (ref 81–99)
MONOCYTES: 8 % (ref 0–12)
MPV: 9.6 FL (ref 7.4–10.4)
NEUTROPHILS: 82 % — ABNORMAL HIGH (ref 40–70)
PLATELET: 424 10*3/uL (ref 140–450)
RBC: 3.33 M/uL — ABNORMAL LOW (ref 4.0–5.2)
RDW: 13.3 % (ref 11.5–14.5)
WBC: 10.3 10*3/uL (ref 4.8–10.8)

## 2017-03-06 LAB — MAGNESIUM: Magnesium: 1.9 mg/dL (ref 1.8–2.4)

## 2017-03-06 MED FILL — TYLENOL 325 MG TABLET: 325 mg | ORAL | Qty: 2

## 2017-03-06 MED FILL — FAMOTIDINE 20 MG TAB: 20 mg | ORAL | Qty: 1

## 2017-03-06 MED FILL — SENNA LAX 8.6 MG TABLET: 8.6 mg | ORAL | Qty: 1

## 2017-03-06 MED FILL — CLINDAMYCIN 150 MG CAP: 150 mg | ORAL | Qty: 2

## 2017-03-06 MED FILL — OXYCODONE 10 MG TAB: 10 mg | ORAL | Qty: 2

## 2017-03-06 MED FILL — ZYVOX 600 MG/300 ML INTRAVENOUS PIGGYBACK: 600 mg/300 mL | INTRAVENOUS | Qty: 300

## 2017-03-06 MED FILL — ENOXAPARIN 40 MG/0.4 ML SUB-Q SYRINGE: 40 mg/0.4 mL | SUBCUTANEOUS | Qty: 0.4

## 2017-03-06 MED FILL — CLONIDINE 0.1 MG TAB: 0.1 mg | ORAL | Qty: 1

## 2017-03-06 MED FILL — DEXTROSE 5% IN NORMAL SALINE IV: INTRAVENOUS | Qty: 1000

## 2017-03-06 NOTE — Progress Notes (Signed)
Problem: Falls - Risk of  Goal: *Absence of Falls  Document Schmid Fall Risk and appropriate interventions in the flowsheet.   Outcome: Progressing Towards Goal  Fall Risk Interventions:  Mobility Interventions: Assess mobility with egress test         Medication Interventions: Patient to call before getting OOB    Elimination Interventions: Call light in reach

## 2017-03-06 NOTE — Other (Signed)
Oral temperature 100.2

## 2017-03-06 NOTE — Other (Signed)
Oral temperature 102.5.  Dr Dwyane DeeAtnafu made aware.  Tylenol administered.

## 2017-03-06 NOTE — Other (Signed)
Bedside and Verbal shift change report given to Kenya (oncoming nurse) by Marie (offgoing nurse). Report included the following information SBAR, Kardex and MAR.

## 2017-03-06 NOTE — Progress Notes (Signed)
Patient Active Problem List   Diagnosis Code   ??? CAP (community acquired pneumonia) J18.9   ??? HTN (hypertension) I10       Subjective: No new complaints    Looks comfortable    Still has brownish phlegmn    Fever decreasing    CP decreased    WBC decreasing    Albumin decreasing too    Physical Examination:  Temp (24hrs), Avg:99.8 ??F (37.7 ??C), Min:98.6 ??F (37 ??C), Max:102.5 ??F (39.2 ??C)      Visit Vitals   ??? BP 118/77 (BP 1 Location: Left arm, BP Patient Position: At rest)   ??? Pulse 80   ??? Temp 98.8 ??F (37.1 ??C)   ??? Resp 18   ??? Ht 6\' 3"  (1.905 m)   ??? Wt 72.6 kg (160 lb)   ??? SpO2 96%   ??? BMI 20 kg/m2       GENERAL:  In no acute distress, comfortable, lying in bed.  SKIN:   Warm, moist.  Normal skin turgor.  No rash or scratch mark noted.   HEENT:   Normocephalic, Atraumatic,     Pink conjunctiva, Anicteric, EOMI,     No sinus tenderness or stuffy nose and     Clear & moist oral mucosa.  NECK: Supple, no JVD, no thyroidmegammy or lymphadenopathy.  AXILLA: No Lymphadenopathy.  LUNGS:   Symmetrical, Resonant and decrease BS BL bas posteriorly  CARDIAC:  S1 & S2, RRR. No murmurs, rubs or gallops heard  ABDOMEN:   Moves with respiration. Soft, NT, BS +.   INGUINAL: No hernia or lymphadenopathy.   GUS:  No supra pubic area tenderness. No CVAT.  EXTREM.:   No edema.   NEURO:   AOX3, No focal deficits..        Labs Reviewed:  Recent Labs      03/06/17   0600  03/05/17   1345  03/04/17   0935   WBC  10.3  14.5*  21.5*   HGB  9.4*  9.4*  11.0*   HCT  27.5*  27.1*  31.8*   PLT  424  415  460*     Recent Labs      03/06/17   0600  03/05/17   1345  03/04/17   0935   NA  137  134*  136   K  3.7  3.6  3.6   CL  99  99  97*   CO2  27  32  27   GLU  106  112*  89   BUN  7  9  10    CREA  0.97  0.93  1.12   CA  7.4*  7.1*  7.8*   MG  1.9   --   2.1   PHOS   --    --   3.2   ALB  1.5*   --    --    SGOT  35   --    --    ALT  43   --    --      No components found for: GLPOC   No results for input(s): PH, PCO2, PO2, HCO3, FIO2 in the last 72 hours.  No results for input(s): INR in the last 72 hours.    No lab exists for component: INREXT, INREXT    Culture  All Micro Results     Procedure Component Value Units Date/Time    CULTURE, BLOOD [[id number]] Collected:  02/27/17 1830    Order Status:  Completed Specimen:  Blood from Blood Updated:  03/04/17 1017     Special Requests: NO SPECIAL REQUESTS        Culture result: NO GROWTH 5 DAYS       CULTURE, BLOOD [[id number]] Collected:  02/24/17 0430    Order Status:  Completed Specimen:  Whole Blood from Blood Updated:  03/02/17 1507     Special Requests: NO SPECIAL REQUESTS        Culture result: NO GROWTH 6 DAYS       CULTURE, BLOOD [[id number]]  (Abnormal)  (Susceptibility) Collected:  02/25/17 0900    Order Status:  Completed Specimen:  Whole Blood from Blood Updated:  02/28/17 1152     Special Requests: NO SPECIAL REQUESTS        GRAM STAIN         GRAM POS COCCI IN CLUSTERS AEROBIC BOTTLE CALLED RESULTS TO/VERIFIED READ BACK WITH NURSE TODD GREGORY AT 1255 ON 02/26/17 EXT.[id number]     Culture result:         **METHICILLIN RESISTANT STAPHYLOCOCCUS AUREUS** AEROBIC BOTTLE (A)              REMAINING BOTTLE(S) HAS/HAVE NO GROWTH SO FAR    CULTURE, RESPIRATORY/SPUTUM/BRONCH Gay FillerW GRAM STAIN [[id number]]  (Abnormal)  (Susceptibility) Collected:  02/25/17 1930    Order Status:  Completed Specimen:  Sputum from Sputum Updated:  02/27/17 1232     Special Requests: NO SPECIAL REQUESTS        GRAM STAIN         MANY POLYMORPHONUCLEAR LEUKOCYTES (PMNs)              MODERATE GRAM POS COCCI IN CLUSTERS     Culture result:         HEAVY **METHICILLIN RESISTANT STAPHYLOCOCCUS AUREUS** PATIENT IS A KNOWN MRSA (A)              MODERATE ALPHA STREPTOCOCCUS, NOT S. PNEUMONIAE (A)    CULTURE, URINE [[id number]] Collected:  02/24/17 0630    Order Status:  Completed Specimen:  Urine from Foley Specimen Updated:  02/26/17 1237     Special Requests: NO SPECIAL REQUESTS         Culture result: NO GROWTH 48 HOURS             Urine  Color   Date Value Ref Range Status   02/24/2017 YELLOW   Final     Appearance   Date Value Ref Range Status   02/24/2017 CLEAR   Final     Specific gravity   Date Value Ref Range Status   02/24/2017 1.028   Final     pH (UA)   Date Value Ref Range Status   02/24/2017 5.5 5.0 - 7.0   Final     Protein   Date Value Ref Range Status   02/24/2017 100 (A) NEG mg/dL Final     Ketone   Date Value Ref Range Status   02/24/2017 TRACE (A) NEG mg/dL Final     Bilirubin   Date Value Ref Range Status   02/24/2017 NEGATIVE  NEG   Final     Blood   Date Value Ref Range Status   02/24/2017 MODERATE (A) NEG   Final     Urobilinogen   Date Value Ref Range Status   02/24/2017 1.0 0.2 - 1.0 EU/dL Final     Nitrites   Date Value Ref Range Status   02/24/2017  NEGATIVE  NEG   Final     Leukocyte Esterase   Date Value Ref Range Status   02/24/2017 NEGATIVE  NEG   Final       Radiology Studies:  Xr Chest Pa Lat    Result Date: 03/03/2017  EXAM:  XR CHEST PA LAT dated 03/03/2017 8:12 AM INDICATION:   BL pneumonia with pain, follow up COMPARISON: 02/26/2017. FINDINGS: PA and lateral radiographs of the chest.  There are persistent bilateral lower lobe infiltrates. There are bilateral pleural effusions.     IMPRESSION: There are persistent bilateral lower lobe infiltrates. There are bilateral pleural effusions.     Xr Chest Pa Lat    Result Date: 02/26/2017  EXAM:  XR CHEST PA LAT dated 02/26/2017 10:31 AM INDICATION:   Persistent fever suspect empyema or abscess. COMPARISON: Chest AP semierect film from 02/24/2017.Chest CT scan 02/25/17. PA and lateral radiographs of the chest.  Dense infiltrate or consolidation in the posterior right midlung adjacent to the fissure in the superior segment right lower lobe measuring approximately 8.6 x 7.3 x 7.3 cm has  increased since previous examination 02/24/17.  Infiltrate left lower lobe has increased.  Moderate  consolidation left lower lobe again noted Lungs are normally inflated.  Normal heart size..  Right hilar prominence compatible with small nodes unchanged. Marland Kitchen     IMPRESSION: Increasing consolidation posterior right midlung and increasing infiltrate left lower lobe since previous examination 02/24/17.  Findings are unchanged since CT scan of 02/25/17. Dense consolidation left lower lobe measuring 4.9 cm in diameter, focal consolidation vs. Beginning of an abscess. Close follow-up examination suggested.  Some distention of colon with air unchanged.     Xr Spine Lumb Min 4 V    Result Date: 02/25/2017  EXAM:  XR SPINE LUMB MIN 4 V dated 02/25/2017 12:28 PM INDICATION:   LBP COMPARISON:  None. FINDINGS:  AP, lateral, and oblique views of the lumbar spine were obtained. No fracture or subluxation. No disc space narrowing. No arthritic changes. No osteolytic or osteoblastic lesion. SI joints are unremarkable. There is opacification of both renal collecting system and ureters from the intravenous contrast injection of the prior CT.     IMPRESSION:   Normal exam.     Ct Chest Wo Cont    Result Date: 02/28/2017  EXAM:  CT CHEST WO CONT  dated 02/28/2017 2:38 PM INDICATION:  MRSA pneumonia worsening suspect abscess formation COMPARISON: CT scan the chest with contrast injection 02/25/17.  CONTRAST:  None. TECHNIQUE: Spiral CT through the thorax without contrast was performed. 2.5 mm axial images with sagittal and coronal reconstructions produced. CT dose reduction was achieved through use of a standardized protocol tailored for this examination and automatic exposure control for dose modulation. The absence of intravenous contrast prevents evaluation for pulmonary emboli or aortic dissection. FINDINGS:Small bulla in the left and right upper lobes.  Focal infiltrate in the anterior part of the left upper lobe unchanged since the previous examination. Bilateral basilar consolidation  again noted.  On the right side it measures approximately 10 cm in craniocaudal dimension unchanged since the previous examination.  On the left side it measures approximately 10 cm , increasing from approximately 9 cm on the previous study.  No significant pleural effusion or empyema detected at this time.  No cavitary lesions other than small bulla within the lung fields.\\ 7.1 mm anterior inferior pericardial effusion again noted.  Right hemidiaphragm elevation.  Heart size is normal.     IMPRESSION:  Bibasilar consolidation in the lung fields left side slightly larger again noted with small infiltrate left upper lobe and bullous changes the left and right lung apices. No cavitary lesion or loculated pleural effusion detected on the current examination.  Trace pericardial effusion measuring 7.1 mm appearing since previous examination.  Right hemidiaphragm elevation.     Ct Chest W Cont    Result Date: 02/25/2017  CT CHEST W CONT dated 02/25/2017 12:19 PM INDICATION:  Pneumonia, Hematemesis COMPARISON: None TECHNIQUE: Spiral CT with IV contrast bolus through the thorax was performed. 2.5 mm axial images with sagittal and coronal reconstructions produced. Contrast material 100 mL Isovue 300. One or more of the following dose reduction techniques were used: automated exposure control, adjustment of the mA and/or kV according to patient size, use of iterative reconstruction technique. INTERPRETATION:  There is less than optimal opacification of the pulmonary arterial tree. No definite pulmonary emboli can be seen. The aorta is unremarkable. There are dense consolidative infiltrates in both lower lobes, and there is patchy infiltration in the left upper lobe. No definite pleural effusion is seen. Multiple air bronchograms are seen in the lower lobes. No hilar or mediastinal mass is identified. The tracheobronchial tree is unremarkable. The thyroid is unremarkable. No cervical lymphadenopathy is  seen. No chest wall abnormality is observed. Limited images the upper abdomen show no significant abnormality. The visualized skeletal structures are unremarkable.     IMPRESSION: 1.  Dense bilateral lower lobe infiltrates consistent with pneumonia. There is also patchy infiltration in the left upper lobe. 2.  Suboptimal opacification of the pulmonary arterial tree. No gross pulmonary embolic disease is observed.    Xr Chest Port    Result Date: 02/24/2017  EXAM:  XR CHEST PORT dated 02/24/2017 8:31 AM INDICATION:  pre op. Nurses note states patient coughing up blood-tinged sputum. COMPARISON:  None. FINDINGS: A portable AP semierect radiograph of the chest was obtained at 0819 hours. There are bilateral lower lung zone infiltrates slightly more prominent on the left than the right. There is no obscuration of the diaphragm or of the heart border, and localization of the infiltrates is difficult. Cannot rule out overlying pleural disease. The upper lung zones are clear. Heart size is normal. No hilar or mediastinal mass.     IMPRESSION:  Bilateral lower lung zone densities consistent with bilateral infiltrates. Consider conventional PA and lateral views for better characterization.     Assessment: Improving    Today's lab was noted.    Plan:  Continue current meds    Monitor culture, fluid and electrolyte.    PT   `

## 2017-03-06 NOTE — Progress Notes (Signed)
Bedside shift change report given to Uche RN (oncoming nurse) by Kenya RN (offgoing nurse). Report included the following information SBAR, Kardex, Intake/Output and MAR.

## 2017-03-06 NOTE — Progress Notes (Signed)
[facility] care [facility] this [facility] stable condition.  IV antibiodics continues.  Orders carried out.  Safety maintained.

## 2017-03-07 ENCOUNTER — Inpatient Hospital Stay: Admit: 2017-03-07 | Payer: MEDICAID | Primary: Family Medicine

## 2017-03-07 LAB — CBC WITH AUTOMATED DIFF
ABS. BASOPHILS: 0 10*3/uL (ref 0.0–0.2)
ABS. EOSINOPHILS: 0.1 10*3/uL (ref 0.0–0.7)
ABS. LYMPHOCYTES: 1.1 10*3/uL — ABNORMAL LOW (ref 1.2–3.4)
ABS. MONOCYTES: 0.8 10*3/uL — ABNORMAL LOW (ref 1.1–3.2)
ABS. NEUTROPHILS: 7.2 10*3/uL — ABNORMAL HIGH (ref 1.4–6.5)
BASOPHILS: 0 % (ref 0–2)
EOSINOPHILS: 1 % (ref 0–5)
HCT: 28.4 % — ABNORMAL LOW (ref 36.8–45.2)
HGB: 9.7 g/dL — ABNORMAL LOW (ref 12.8–15.0)
IMMATURE GRANULOCYTES: 0 % (ref 0.0–5.0)
LYMPHOCYTES: 12 % — ABNORMAL LOW (ref 16–40)
MCH: 28.4 PG (ref 27–31)
MCHC: 34.2 g/dL (ref 32–36)
MCV: 83 FL (ref 81–99)
MONOCYTES: 9 % (ref 0–12)
MPV: 9.5 FL (ref 7.4–10.4)
NEUTROPHILS: 78 % — ABNORMAL HIGH (ref 40–70)
PLATELET: 469 10*3/uL — ABNORMAL HIGH (ref 140–450)
RBC: 3.42 M/uL — ABNORMAL LOW (ref 4.0–5.2)
RDW: 13.2 % (ref 11.5–14.5)
WBC: 9.2 10*3/uL (ref 4.8–10.8)

## 2017-03-07 LAB — METABOLIC PANEL, COMPREHENSIVE
A-G Ratio: 0.4 — ABNORMAL LOW (ref 1.0–3.1)
ALT (SGPT): 66 U/L (ref 12.0–78.0)
AST (SGOT): 55 U/L — ABNORMAL HIGH (ref 15–37)
Albumin: 1.5 g/dL — ABNORMAL LOW (ref 3.40–5.00)
Alk. phosphatase: 128 U/L — ABNORMAL HIGH (ref 46–116)
Anion gap: 13 mmol/L (ref 10–17)
BUN/Creatinine ratio: 8 (ref 6.0–20.0)
BUN: 7 MG/DL (ref 7–18)
Bilirubin, total: 1 MG/DL (ref 0.20–1.00)
CO2: 28 mmol/L (ref 21–32)
Calcium: 7.4 MG/DL — ABNORMAL LOW (ref 8.5–10.1)
Chloride: 102 mmol/L (ref 98–107)
Creatinine: 0.86 MG/DL (ref 0.6–1.3)
GFR est AA: >60 mL/min/{1.73_m2} (ref >60)
GFR est non-AA: >60 mL/min/{1.73_m2} (ref >60)
Globulin: 3.7 g/dL
Glucose: 101 mg/dL (ref 74–106)
Potassium: 4.2 mmol/L (ref 3.50–5.10)
Protein, total: 5.2 g/dL — ABNORMAL LOW (ref 6.40–8.20)
Sodium: 139 mmol/L (ref 136–145)

## 2017-03-07 LAB — MAGNESIUM: Magnesium: 1.7 mg/dL — ABNORMAL LOW (ref 1.8–2.4)

## 2017-03-07 MED FILL — ZYVOX 600 MG/300 ML INTRAVENOUS PIGGYBACK: 600 mg/300 mL | INTRAVENOUS | Qty: 300

## 2017-03-07 MED FILL — CLINDAMYCIN 150 MG CAP: 150 mg | ORAL | Qty: 2

## 2017-03-07 MED FILL — OXYCODONE-ACETAMINOPHEN 5 MG-325 MG TAB: 5-325 mg | ORAL | Qty: 2

## 2017-03-07 MED FILL — OXYCODONE 10 MG TAB: 10 mg | ORAL | Qty: 2

## 2017-03-07 MED FILL — SENNA LAX 8.6 MG TABLET: 8.6 mg | ORAL | Qty: 1

## 2017-03-07 MED FILL — CLONIDINE 0.1 MG TAB: 0.1 mg | ORAL | Qty: 1

## 2017-03-07 MED FILL — FAMOTIDINE 20 MG TAB: 20 mg | ORAL | Qty: 1

## 2017-03-07 MED FILL — ENOXAPARIN 40 MG/0.4 ML SUB-Q SYRINGE: 40 mg/0.4 mL | SUBCUTANEOUS | Qty: 0.4

## 2017-03-07 MED FILL — TYLENOL 325 MG TABLET: 325 mg | ORAL | Qty: 2

## 2017-03-07 NOTE — Other (Signed)
Bedside and Verbal shift change report given to April RN  (oncoming nurse) by Starlyn SkeansHorace RN (offgoing nurse). Report included the following information SBAR, Kardex and MAR.

## 2017-03-07 NOTE — Progress Notes (Signed)
Patient Active Problem List   Diagnosis Code   ??? CAP (community acquired pneumonia) J18.9   ??? HTN (hypertension) I10       Subjective: No new complaints    Looks comfortable and smiling    WBC decreased to 9 K    Chest x-ray showed worsening of infiltrate on the left side    Physical Examination:  Temp (24hrs), Avg:99 ??F (37.2 ??C), Min:98 ??F (36.7 ??C), Max:99.8 ??F (37.7 ??C)      Visit Vitals   ??? BP 124/78 (BP 1 Location: Left arm, BP Patient Position: At rest;Trendelenburg)   ??? Pulse 89   ??? Temp 98.9 ??F (37.2 ??C)   ??? Resp 18   ??? Ht 6\' 3"  (1.905 m)   ??? Wt 72.6 kg (160 lb)   ??? SpO2 95%   ??? BMI 20 kg/m2       GENERAL:  In no acute distress, comfortable, lying in bed.  SKIN:   Warm, moist.  Normal skin turgor.  No rash or scratch mark noted.   HEENT:   Normocephalic, Atraumatic,     Pink conjunctiva, Anicteric, EOMI,     No sinus tenderness or stuffy nose and     Clear & moist oral mucosa.  NECK: Supple, no JVD, no thyroidmegammy or lymphadenopathy.  AXILLA: No Lymphadenopathy.  LUNGS:   Symmetrical, Resonant and Decrease BS BL base. No wheezes, rales,or rhonchi,  CARDIAC:  S1 & S2, RRR. No murmurs, rubs or gallops heard  ABDOMEN:   Moves with respiration. Soft, NT, BS +.   INGUINAL: No hernia or lymphadenopathy.   GUS:  No supra pubic area tenderness. No CVAT.  EXTREM.:   No edema.   NEURO:   AOX3, No focal deficits..        Labs Reviewed:  Recent Labs      03/07/17   0555  03/06/17   0600  03/05/17   1345   WBC  9.2  10.3  14.5*   HGB  9.7*  9.4*  9.4*   HCT  28.4*  27.5*  27.1*   PLT  469*  424  415     Recent Labs      03/07/17   0555  03/06/17   0600  03/05/17   1345   NA  139  137  134*   K  4.2  3.7  3.6   CL  102  99  99   CO2  28  27  32   GLU  101  106  112*   BUN  7  7  9    CREA  0.86  0.97  0.93   CA  7.4*  7.4*  7.1*   MG  1.7*  1.9   --    ALB  1.5*  1.5*   --    SGOT  55*  35   --    ALT  66  43   --      No components found for: GLPOC   No results for input(s): PH, PCO2, PO2, HCO3, FIO2 in the last 72 hours.  No results for input(s): INR in the last 72 hours.    No lab exists for component: INREXT, Marval Regal    Culture  All Micro Results     Procedure Component Value Units Date/Time    CULTURE, BLOOD [[id number]] Collected:  02/27/17 1830    Order Status:  Completed Specimen:  Blood from Blood Updated:  03/04/17 1017  Special Requests: NO SPECIAL REQUESTS        Culture result: NO GROWTH 5 DAYS       CULTURE, BLOOD [[id number]] Collected:  02/24/17 0430    Order Status:  Completed Specimen:  Whole Blood from Blood Updated:  03/02/17 1507     Special Requests: NO SPECIAL REQUESTS        Culture result: NO GROWTH 6 DAYS       CULTURE, BLOOD [[id number]]  (Abnormal)  (Susceptibility) Collected:  02/25/17 0900    Order Status:  Completed Specimen:  Whole Blood from Blood Updated:  02/28/17 1152     Special Requests: NO SPECIAL REQUESTS        GRAM STAIN         GRAM POS COCCI IN CLUSTERS AEROBIC BOTTLE CALLED RESULTS TO/VERIFIED READ BACK WITH NURSE TODD GREGORY AT 1255 ON 02/26/17 EXT.3426     Culture result:         **METHICILLIN RESISTANT STAPHYLOCOCCUS AUREUS** AEROBIC BOTTLE (A)              REMAINING BOTTLE(S) HAS/HAVE NO GROWTH SO FAR    CULTURE, RESPIRATORY/SPUTUM/BRONCH Gay Filler STAIN [[id number]]  (Abnormal)  (Susceptibility) Collected:  02/25/17 1930    Order Status:  Completed Specimen:  Sputum from Sputum Updated:  02/27/17 1232     Special Requests: NO SPECIAL REQUESTS        GRAM STAIN         MANY POLYMORPHONUCLEAR LEUKOCYTES (PMNs)              MODERATE GRAM POS COCCI IN CLUSTERS     Culture result:         HEAVY **METHICILLIN RESISTANT STAPHYLOCOCCUS AUREUS** PATIENT IS A KNOWN MRSA (A)              MODERATE ALPHA STREPTOCOCCUS, NOT S. PNEUMONIAE (A)    CULTURE, URINE [[id number]] Collected:  02/24/17 0630    Order Status:  Completed Specimen:  Urine from Foley Specimen Updated:  02/26/17 1237     Special Requests: NO SPECIAL REQUESTS         Culture result: NO GROWTH 48 HOURS             Urine  Color   Date Value Ref Range Status   02/24/2017 YELLOW   Final     Appearance   Date Value Ref Range Status   02/24/2017 CLEAR   Final     Specific gravity   Date Value Ref Range Status   02/24/2017 1.028   Final     pH (UA)   Date Value Ref Range Status   02/24/2017 5.5 5.0 - 7.0   Final     Protein   Date Value Ref Range Status   02/24/2017 100 (A) NEG mg/dL Final     Ketone   Date Value Ref Range Status   02/24/2017 TRACE (A) NEG mg/dL Final     Bilirubin   Date Value Ref Range Status   02/24/2017 NEGATIVE  NEG   Final     Blood   Date Value Ref Range Status   02/24/2017 MODERATE (A) NEG   Final     Urobilinogen   Date Value Ref Range Status   02/24/2017 1.0 0.2 - 1.0 EU/dL Final     Nitrites   Date Value Ref Range Status   02/24/2017 NEGATIVE  NEG   Final     Leukocyte Esterase   Date Value Ref Range Status   02/24/2017  NEGATIVE  NEG   Final       Radiology Studies:  Xr Chest Pa Lat    Result Date: 03/07/2017  EXAM:  XR CHEST PA LAT dated 03/07/2017 11:29 AM INDICATION:   Follow up MRSA pneumonia COMPARISON: Chest AP x-ray 03/03/17 0800 hours FINDINGS: PA and lateral radiographs of the chest.  Moderate consolidation posterior left lower lobe slightly increasing since previous examination.  Infiltrate right lower lung again noted. Sharply marginated line possible skinfold superimposed upon the right lung appearing since previous examination.  Right hemidiaphragm elevation.  Heart size is mildly enlarged.  Right-sided PICC line tip in the superior vena cava and right atrial junction in satisfactory position appearing since previous examination..     IMPRESSION: Consolidation posterior left lower lung slightly increasing since previous examination.  Right lower lung infiltrate unchanged. PICC line tip in superior vena cava and right atrial junction appearing since previous examination.     Xr Chest Pa Lat    Result Date: 03/03/2017   EXAM:  XR CHEST PA LAT dated 03/03/2017 8:12 AM INDICATION:   BL pneumonia with pain, follow up COMPARISON: 02/26/2017. FINDINGS: PA and lateral radiographs of the chest.  There are persistent bilateral lower lobe infiltrates. There are bilateral pleural effusions.     IMPRESSION: There are persistent bilateral lower lobe infiltrates. There are bilateral pleural effusions.     Xr Chest Pa Lat    Result Date: 02/26/2017  EXAM:  XR CHEST PA LAT dated 02/26/2017 10:31 AM INDICATION:   Persistent fever suspect empyema or abscess. COMPARISON: Chest AP semierect film from 02/24/2017.Chest CT scan 02/25/17. PA and lateral radiographs of the chest.  Dense infiltrate or consolidation in the posterior right midlung adjacent to the fissure in the superior segment right lower lobe measuring approximately 8.6 x 7.3 x 7.3 cm has  increased since previous examination 02/24/17.  Infiltrate left lower lobe has increased.  Moderate consolidation left lower lobe again noted Lungs are normally inflated.  Normal heart size..  Right hilar prominence compatible with small nodes unchanged. Marland Kitchen     IMPRESSION: Increasing consolidation posterior right midlung and increasing infiltrate left lower lobe since previous examination 02/24/17.  Findings are unchanged since CT scan of 02/25/17. Dense consolidation left lower lobe measuring 4.9 cm in diameter, focal consolidation vs. Beginning of an abscess. Close follow-up examination suggested.  Some distention of colon with air unchanged.     Xr Spine Lumb Min 4 V    Result Date: 02/25/2017  EXAM:  XR SPINE LUMB MIN 4 V dated 02/25/2017 12:28 PM INDICATION:   LBP COMPARISON:  None. FINDINGS:  AP, lateral, and oblique views of the lumbar spine were obtained. No fracture or subluxation. No disc space narrowing. No arthritic changes. No osteolytic or osteoblastic lesion. SI joints are unremarkable. There is opacification of both renal collecting system and  ureters from the intravenous contrast injection of the prior CT.     IMPRESSION:   Normal exam.     Ct Chest Wo Cont    Result Date: 02/28/2017  EXAM:  CT CHEST WO CONT  dated 02/28/2017 2:38 PM INDICATION:  MRSA pneumonia worsening suspect abscess formation COMPARISON: CT scan the chest with contrast injection 02/25/17.  CONTRAST:  None. TECHNIQUE: Spiral CT through the thorax without contrast was performed. 2.5 mm axial images with sagittal and coronal reconstructions produced. CT dose reduction was achieved through use of a standardized protocol tailored for this examination and automatic exposure control for dose modulation. The absence  of intravenous contrast prevents evaluation for pulmonary emboli or aortic dissection. FINDINGS:Small bulla in the left and right upper lobes.  Focal infiltrate in the anterior part of the left upper lobe unchanged since the previous examination. Bilateral basilar consolidation again noted.  On the right side it measures approximately 10 cm in craniocaudal dimension unchanged since the previous examination.  On the left side it measures approximately 10 cm , increasing from approximately 9 cm on the previous study.  No significant pleural effusion or empyema detected at this time.  No cavitary lesions other than small bulla within the lung fields.\\ 7.1 mm anterior inferior pericardial effusion again noted.  Right hemidiaphragm elevation.  Heart size is normal.     IMPRESSION: Bibasilar consolidation in the lung fields left side slightly larger again noted with small infiltrate left upper lobe and bullous changes the left and right lung apices. No cavitary lesion or loculated pleural effusion detected on the current examination.  Trace pericardial effusion measuring 7.1 mm appearing since previous examination.  Right hemidiaphragm elevation.     Ct Chest W Cont    Result Date: 02/25/2017  CT CHEST W CONT dated 02/25/2017 12:19 PM INDICATION:  Pneumonia,  Hematemesis COMPARISON: None TECHNIQUE: Spiral CT with IV contrast bolus through the thorax was performed. 2.5 mm axial images with sagittal and coronal reconstructions produced. Contrast material 100 mL Isovue 300. One or more of the following dose reduction techniques were used: automated exposure control, adjustment of the mA and/or kV according to patient size, use of iterative reconstruction technique. INTERPRETATION:  There is less than optimal opacification of the pulmonary arterial tree. No definite pulmonary emboli can be seen. The aorta is unremarkable. There are dense consolidative infiltrates in both lower lobes, and there is patchy infiltration in the left upper lobe. No definite pleural effusion is seen. Multiple air bronchograms are seen in the lower lobes. No hilar or mediastinal mass is identified. The tracheobronchial tree is unremarkable. The thyroid is unremarkable. No cervical lymphadenopathy is seen. No chest wall abnormality is observed. Limited images the upper abdomen show no significant abnormality. The visualized skeletal structures are unremarkable.     IMPRESSION: 1.  Dense bilateral lower lobe infiltrates consistent with pneumonia. There is also patchy infiltration in the left upper lobe. 2.  Suboptimal opacification of the pulmonary arterial tree. No gross pulmonary embolic disease is observed.    Xr Chest Port    Result Date: 02/24/2017  EXAM:  XR CHEST PORT dated 02/24/2017 8:31 AM INDICATION:  pre op. Nurses note states patient coughing up blood-tinged sputum. COMPARISON:  None. FINDINGS: A portable AP semierect radiograph of the chest was obtained at 0819 hours. There are bilateral lower lung zone infiltrates slightly more prominent on the left than the right. There is no obscuration of the diaphragm or of the heart border, and localization of the infiltrates is difficult. Cannot rule out overlying pleural disease. The upper lung zones  are clear. Heart size is normal. No hilar or mediastinal mass.     IMPRESSION:  Bilateral lower lung zone densities consistent with bilateral infiltrates. Consider conventional PA and lateral views for better characterization.     Assessment: Clinically Improving though the Left infiltrate is worsening    Today's lab was noted.    Plan:  Continue current meds    Monitor culture, fluid and electrolyte.    May need to repeat CT scan and if forms para pneumonic effusion or empyema, he may need VATS and may need to  be transferred to tertiary institution.   `

## 2017-03-07 NOTE — Progress Notes (Signed)
Bedside and Verbal shift change report given to [facility],RN (oncoming nurse) by Dianah FieldUche,RN (offgoing nurse). Report included the following information SBAR, Kardex, MAR, Accordion and Recent Results.

## 2017-03-07 NOTE — Progress Notes (Signed)
Problem: Falls - Risk of  Goal: *Absence of Falls  Document Schmid Fall Risk and appropriate interventions in the flowsheet.   Outcome: Progressing Towards Goal  Fall Risk Interventions:  Mobility Interventions: Assess mobility with egress test         Medication Interventions: Patient to call before getting OOB    Elimination Interventions: Call light in reach

## 2017-03-08 MED FILL — CLONIDINE 0.1 MG TAB: 0.1 mg | ORAL | Qty: 1

## 2017-03-08 MED FILL — ZYVOX 600 MG/300 ML INTRAVENOUS PIGGYBACK: 600 mg/300 mL | INTRAVENOUS | Qty: 300

## 2017-03-08 MED FILL — CLINDAMYCIN 150 MG CAP: 150 mg | ORAL | Qty: 2

## 2017-03-08 MED FILL — TYLENOL 325 MG TABLET: 325 mg | ORAL | Qty: 2

## 2017-03-08 MED FILL — FAMOTIDINE 20 MG TAB: 20 mg | ORAL | Qty: 1

## 2017-03-08 MED FILL — OXYCODONE-ACETAMINOPHEN 5 MG-325 MG TAB: 5-325 mg | ORAL | Qty: 2

## 2017-03-08 MED FILL — ENOXAPARIN 40 MG/0.4 ML SUB-Q SYRINGE: 40 mg/0.4 mL | SUBCUTANEOUS | Qty: 0.4

## 2017-03-08 MED FILL — SENNA LAX 8.6 MG TABLET: 8.6 mg | ORAL | Qty: 1

## 2017-03-08 MED FILL — OXYCODONE 10 MG TAB: 10 mg | ORAL | Qty: 2

## 2017-03-08 NOTE — Progress Notes (Signed)
Physical Therapy Initial Evaluation:    Orders reviewed, chart reviewed, and initial evaluation completed on William Vega.A&Ox4    Chief complaint, pneumonia  Date of onset: 02/23/17  Mechanism of injury: Pt is 41 year old male who was admitted to [facility] due to pneumonia  Pre-Morbid functionality: Pt is incarcerated, PTA, he was independent with ADLs and ambulation without AD  Previous Therapies: None  Occupation: N/A  Current work status: No  Patient presents at an overall level of assistance of supervision with basic functional mobility. Results of the evaluation consist of:   Pt in bed upon arrival    Gross Assessment AROM: Within functional limits, Strength: Within functional limits, Coordination: Within functional limits     Pain Screen Pain Scale 1: Numeric (0 - 10), Pain Intensity 1: 8, Pain Onset 1: since two weeks, Pain Location 1: Back, Pain Orientation 1: Posterior, Pain Description 1: Aching, Pain Intervention(s) 1: Medication (see MAR), Patient Stated Pain Goal: 0, Pain Reassessment 1: Yes     Gait Speed/Cadence: Slow, Ambulation - Level of Assistance: Contact guard assistance, Distance (ft):  (200'x3, including small incline and decline, manual and verbal cues for erect posture), Assistive Device: Gait belt     Transfers Sit to Stand: Independent, Stand to Sit: Independent, Stand Pivot Transfers: Independent, Independent with toilet transfer and hygiene care    Bed Mobility Rolling: Independent, Supine to Sit: Independent, Scooting: Independent      Patient Response Patient Response: Pt tolerated session well, no adverse reactions identified as a result of PT. Following assessment, pt was left sitting up in chair, all needs addressed, RN at bedside.     Based on this evaluation, the patient will benefit from physicial therapy service daily for duration of hospital stay toward the achievement of set goals. Patient demonstrates the following rehabilitation potential towards  the goals: very likely to progress    Goals: increased gait and walk better    Treatment Plan: balance exercise, family education and gait training    The goals and plan of care have been discussed with the patient. The patient agree to work toward stated goal(s). Projected outcome possibility is functional recovery.    PT recommends return to previous living environment    Thanks for the consult.  Therapist: Salomon Fickarlene D Rivera   [phone]  25 minutes

## 2017-03-08 NOTE — Progress Notes (Signed)
Assumed care at 1930. Alert/ oriented xs. Denies chest pain/ shortness of breath. Productive cough with yellow sputum. Temp 101.7 (decreased from temp 102.3). Encouraged deep breathing exercises or oral fluids. Continued on IV antibiotics and IV fluids. Ongoing monitoring.

## 2017-03-08 NOTE — Progress Notes (Signed)
Patient oob to bathroom x2 this shift without assistance.  Vital signs stable, temp 100.2. Awaiting clindamycin from the pharmacy. Patient eating and drinking adequate amounts. Continues to have yellow productive sputum.

## 2017-03-08 NOTE — Other (Signed)
Reason for Admission: Fever  Abdominal Pain  Influenza      Problem List:   Hospital Problems  Date Reviewed: 02/23/2017          Codes Class Noted POA    HTN (hypertension) ICD-10-CM: I10  ICD-9-CM: 401.9  03/02/2017 Yes        * (Principal)CAP (community acquired pneumonia) ICD-10-CM: J18.9  ICD-9-CM: 486  02/23/2017 Yes              Background:    Past Medical History: No past medical history on file.                                Pain Assessment:  Pain Intensity 1: 7 (03/08/17 1455)  Pain Location 1: Back   Pain Intervention(s) 1: Medication (see MAR)    Patient Stated Pain Goal: 0            Time of last Intervention: 4 hours  Effectiveness of intervention: some relief                Summary of Shift :   Bedside and Verbal shift change report given to Dondra SpryGail (Cabin crewoncoming nurse) by Revonda StandardAllison (offgoing nurse). Report included the following information SBAR, Kardex, Intake/Output, MAR, Accordion, Recent Results and Med Rec Status.

## 2017-03-08 NOTE — Progress Notes (Signed)
Patient Active Problem List   Diagnosis Code   ??? CAP (community acquired pneumonia) J18.9   ??? HTN (hypertension) I10       Subjective: Feels better and stronger    PT in progress    Unfortunately the fever is back and LLL got worse specially on moving    Physical Examination:  Temp (24hrs), Avg:100.6 ??F (38.1 ??C), Min:99.2 ??F (37.3 ??C), Max:102.3 ??F (39.1 ??C)      Visit Vitals   ??? BP 117/73 (BP 1 Location: Left arm, BP Patient Position: Post activity)   ??? Pulse 94   ??? Temp 99.2 ??F (37.3 ??C)   ??? Resp 20   ??? Ht 6\' 3"  (1.905 m)   ??? Wt 72.6 kg (160 lb)   ??? SpO2 96%   ??? BMI 20 kg/m2       GENERAL:  In no acute distress, comfortable, lying in bed.  SKIN:   Warm, moist.  Normal skin turgor.  No rash or scratch mark noted.   HEENT:   Normocephalic, Atraumatic,     Pink conjunctiva, Anicteric, EOMI,     No sinus tenderness or stuffy nose and     Clear & moist oral mucosa.  NECK: Supple, no JVD, no thyroidmegammy or lymphadenopathy.  AXILLA: No Lymphadenopathy.  LUNGS:   Symmetrical, Resonant and CTA BL. No wheezes, rales,or rhonchi,  CARDIAC:  S1 & S2, RRR. No murmurs, rubs or gallops heard  ABDOMEN:   Moves with respiration. Soft, NT, BS +.   INGUINAL: No hernia or lymphadenopathy.   GUS:  No supra pubic area tenderness. No CVAT.  EXTREM.:   No edema.   NEURO:   AOX3, No focal deficits..        Labs Reviewed:  Recent Labs      03/07/17   0555  03/06/17   0600   WBC  9.2  10.3   HGB  9.7*  9.4*   HCT  28.4*  27.5*   PLT  469*  424     Recent Labs      03/07/17   0555  03/06/17   0600   NA  139  137   K  4.2  3.7   CL  102  99   CO2  28  27   GLU  101  106   BUN  7  7   CREA  0.86  0.97   CA  7.4*  7.4*   MG  1.7*  1.9   ALB  1.5*  1.5*   SGOT  55*  35   ALT  66  43     No components found for: GLPOC  No results for input(s): PH, PCO2, PO2, HCO3, FIO2 in the last 72 hours.  No results for input(s): INR in the last 72 hours.    No lab exists for component: INREXT, INREXT    Culture  All Micro Results      Procedure Component Value Units Date/Time    CULTURE, BLOOD [[id number]] Collected:  02/27/17 1830    Order Status:  Completed Specimen:  Blood from Blood Updated:  03/04/17 1017     Special Requests: NO SPECIAL REQUESTS        Culture result: NO GROWTH 5 DAYS       CULTURE, BLOOD [[id number]] Collected:  02/24/17 0430    Order Status:  Completed Specimen:  Whole Blood from Blood Updated:  03/02/17 1507     Special Requests: NO SPECIAL REQUESTS  Culture result: NO GROWTH 6 DAYS       CULTURE, BLOOD [[id number]]  (Abnormal)  (Susceptibility) Collected:  02/25/17 0900    Order Status:  Completed Specimen:  Whole Blood from Blood Updated:  02/28/17 1152     Special Requests: NO SPECIAL REQUESTS        GRAM STAIN         GRAM POS COCCI IN CLUSTERS AEROBIC BOTTLE CALLED RESULTS TO/VERIFIED READ BACK WITH NURSE TODD GREGORY AT 1255 ON 02/26/17 EXT.[id number]     Culture result:         **METHICILLIN RESISTANT STAPHYLOCOCCUS AUREUS** AEROBIC BOTTLE (A)              REMAINING BOTTLE(S) HAS/HAVE NO GROWTH SO FAR    CULTURE, RESPIRATORY/SPUTUM/BRONCH Gay FillerW GRAM STAIN [[id number]]  (Abnormal)  (Susceptibility) Collected:  02/25/17 1930    Order Status:  Completed Specimen:  Sputum from Sputum Updated:  02/27/17 1232     Special Requests: NO SPECIAL REQUESTS        GRAM STAIN         MANY POLYMORPHONUCLEAR LEUKOCYTES (PMNs)              MODERATE GRAM POS COCCI IN CLUSTERS     Culture result:         HEAVY **METHICILLIN RESISTANT STAPHYLOCOCCUS AUREUS** PATIENT IS A KNOWN MRSA (A)              MODERATE ALPHA STREPTOCOCCUS, NOT S. PNEUMONIAE (A)    CULTURE, URINE [[id number]] Collected:  02/24/17 0630    Order Status:  Completed Specimen:  Urine from Foley Specimen Updated:  02/26/17 1237     Special Requests: NO SPECIAL REQUESTS        Culture result: NO GROWTH 48 HOURS             Urine  Color   Date Value Ref Range Status   02/24/2017 YELLOW   Final     Appearance   Date Value Ref Range Status   02/24/2017 CLEAR   Final      Specific gravity   Date Value Ref Range Status   02/24/2017 1.028   Final     pH (UA)   Date Value Ref Range Status   02/24/2017 5.5 5.0 - 7.0   Final     Protein   Date Value Ref Range Status   02/24/2017 100 (A) NEG mg/dL Final     Ketone   Date Value Ref Range Status   02/24/2017 TRACE (A) NEG mg/dL Final     Bilirubin   Date Value Ref Range Status   02/24/2017 NEGATIVE  NEG   Final     Blood   Date Value Ref Range Status   02/24/2017 MODERATE (A) NEG   Final     Urobilinogen   Date Value Ref Range Status   02/24/2017 1.0 0.2 - 1.0 EU/dL Final     Nitrites   Date Value Ref Range Status   02/24/2017 NEGATIVE  NEG   Final     Leukocyte Esterase   Date Value Ref Range Status   02/24/2017 NEGATIVE  NEG   Final       Radiology Studies:  Xr Chest Pa Lat    Result Date: 03/07/2017  EXAM:  XR CHEST PA LAT dated 03/07/2017 11:29 AM INDICATION:   Follow up MRSA pneumonia COMPARISON: Chest AP x-ray 03/03/17 0800 hours FINDINGS: PA and lateral radiographs of the chest.  Moderate consolidation posterior left lower  lobe slightly increasing since previous examination.  Infiltrate right lower lung again noted. Sharply marginated line possible skinfold superimposed upon the right lung appearing since previous examination.  Right hemidiaphragm elevation.  Heart size is mildly enlarged.  Right-sided PICC line tip in the superior vena cava and right atrial junction in satisfactory position appearing since previous examination..     IMPRESSION: Consolidation posterior left lower lung slightly increasing since previous examination.  Right lower lung infiltrate unchanged. PICC line tip in superior vena cava and right atrial junction appearing since previous examination.     Xr Chest Pa Lat    Result Date: 03/03/2017  EXAM:  XR CHEST PA LAT dated 03/03/2017 8:12 AM INDICATION:   BL pneumonia with pain, follow up COMPARISON: 02/26/2017. FINDINGS: PA and lateral radiographs of the chest.  There are persistent bilateral lower lobe  infiltrates. There are bilateral pleural effusions.     IMPRESSION: There are persistent bilateral lower lobe infiltrates. There are bilateral pleural effusions.     Xr Chest Pa Lat    Result Date: 02/26/2017  EXAM:  XR CHEST PA LAT dated 02/26/2017 10:31 AM INDICATION:   Persistent fever suspect empyema or abscess. COMPARISON: Chest AP semierect film from 02/24/2017.Chest CT scan 02/25/17. PA and lateral radiographs of the chest.  Dense infiltrate or consolidation in the posterior right midlung adjacent to the fissure in the superior segment right lower lobe measuring approximately 8.6 x 7.3 x 7.3 cm has  increased since previous examination 02/24/17.  Infiltrate left lower lobe has increased.  Moderate consolidation left lower lobe again noted Lungs are normally inflated.  Normal heart size..  Right hilar prominence compatible with small nodes unchanged. Marland Kitchen     IMPRESSION: Increasing consolidation posterior right midlung and increasing infiltrate left lower lobe since previous examination 02/24/17.  Findings are unchanged since CT scan of 02/25/17. Dense consolidation left lower lobe measuring 4.9 cm in diameter, focal consolidation vs. Beginning of an abscess. Close follow-up examination suggested.  Some distention of colon with air unchanged.     Xr Spine Lumb Min 4 V    Result Date: 02/25/2017  EXAM:  XR SPINE LUMB MIN 4 V dated 02/25/2017 12:28 PM INDICATION:   LBP COMPARISON:  None. FINDINGS:  AP, lateral, and oblique views of the lumbar spine were obtained. No fracture or subluxation. No disc space narrowing. No arthritic changes. No osteolytic or osteoblastic lesion. SI joints are unremarkable. There is opacification of both renal collecting system and ureters from the intravenous contrast injection of the prior CT.     IMPRESSION:   Normal exam.     Ct Chest Wo Cont    Result Date: 02/28/2017  EXAM:  CT CHEST WO CONT  dated 02/28/2017 2:38 PM INDICATION:  MRSA  pneumonia worsening suspect abscess formation COMPARISON: CT scan the chest with contrast injection 02/25/17.  CONTRAST:  None. TECHNIQUE: Spiral CT through the thorax without contrast was performed. 2.5 mm axial images with sagittal and coronal reconstructions produced. CT dose reduction was achieved through use of a standardized protocol tailored for this examination and automatic exposure control for dose modulation. The absence of intravenous contrast prevents evaluation for pulmonary emboli or aortic dissection. FINDINGS:Small bulla in the left and right upper lobes.  Focal infiltrate in the anterior part of the left upper lobe unchanged since the previous examination. Bilateral basilar consolidation again noted.  On the right side it measures approximately 10 cm in craniocaudal dimension unchanged since the previous examination.  On the left  side it measures approximately 10 cm , increasing from approximately 9 cm on the previous study.  No significant pleural effusion or empyema detected at this time.  No cavitary lesions other than small bulla within the lung fields.\\ 7.1 mm anterior inferior pericardial effusion again noted.  Right hemidiaphragm elevation.  Heart size is normal.     IMPRESSION: Bibasilar consolidation in the lung fields left side slightly larger again noted with small infiltrate left upper lobe and bullous changes the left and right lung apices. No cavitary lesion or loculated pleural effusion detected on the current examination.  Trace pericardial effusion measuring 7.1 mm appearing since previous examination.  Right hemidiaphragm elevation.     Ct Chest W Cont    Result Date: 02/25/2017  CT CHEST W CONT dated 02/25/2017 12:19 PM INDICATION:  Pneumonia, Hematemesis COMPARISON: None TECHNIQUE: Spiral CT with IV contrast bolus through the thorax was performed. 2.5 mm axial images with sagittal and coronal reconstructions produced. Contrast material 100 mL Isovue 300. One  or more of the following dose reduction techniques were used: automated exposure control, adjustment of the mA and/or kV according to patient size, use of iterative reconstruction technique. INTERPRETATION:  There is less than optimal opacification of the pulmonary arterial tree. No definite pulmonary emboli can be seen. The aorta is unremarkable. There are dense consolidative infiltrates in both lower lobes, and there is patchy infiltration in the left upper lobe. No definite pleural effusion is seen. Multiple air bronchograms are seen in the lower lobes. No hilar or mediastinal mass is identified. The tracheobronchial tree is unremarkable. The thyroid is unremarkable. No cervical lymphadenopathy is seen. No chest wall abnormality is observed. Limited images the upper abdomen show no significant abnormality. The visualized skeletal structures are unremarkable.     IMPRESSION: 1.  Dense bilateral lower lobe infiltrates consistent with pneumonia. There is also patchy infiltration in the left upper lobe. 2.  Suboptimal opacification of the pulmonary arterial tree. No gross pulmonary embolic disease is observed.    Xr Chest Port    Result Date: 02/24/2017  EXAM:  XR CHEST PORT dated 02/24/2017 8:31 AM INDICATION:  pre op. Nurses note states patient coughing up blood-tinged sputum. COMPARISON:  None. FINDINGS: A portable AP semierect radiograph of the chest was obtained at 0819 hours. There are bilateral lower lung zone infiltrates slightly more prominent on the left than the right. There is no obscuration of the diaphragm or of the heart border, and localization of the infiltrates is difficult. Cannot rule out overlying pleural disease. The upper lung zones are clear. Heart size is normal. No hilar or mediastinal mass.     IMPRESSION:  Bilateral lower lung zone densities consistent with bilateral infiltrates. Consider conventional PA and lateral views for better characterization.     Assessment: Same     Today's lab was noted.    Plan:  Continue current meds    Monitor culture, fluid and electrolyte.    CT of chest in AM   `

## 2017-03-08 NOTE — Progress Notes (Signed)
Temp 99.2- continue to encourage oral fluids. OOB to bathroom; No shortness of breath. Evaluated by attending. Dr. Dwyane DeeAtnafu made aware of elevated temp and updated on patient status. Orders pending.

## 2017-03-08 NOTE — Other (Signed)
Pt continues to breath easily on 2 L NC with sat at 100%. I will assess his sats on RA. I did not note any air movement is note to his LLL, RML, and RLL.  He is with an improving appetite that is still poor.  He states he is feeling bette however. Will contintue to monitor.

## 2017-03-09 ENCOUNTER — Inpatient Hospital Stay: Admit: 2017-03-09 | Payer: MEDICAID | Primary: Family Medicine

## 2017-03-09 LAB — CBC WITH AUTOMATED DIFF
ABS. BASOPHILS: 0 10*3/uL (ref 0.0–0.2)
ABS. EOSINOPHILS: 0.1 10*3/uL (ref 0.0–0.7)
ABS. LYMPHOCYTES: 1.4 10*3/uL (ref 1.2–3.4)
ABS. MONOCYTES: 1.3 10*3/uL (ref 1.1–3.2)
ABS. NEUTROPHILS: 5.9 10*3/uL (ref 1.4–6.5)
BASOPHILS: 0 % (ref 0–2)
EOSINOPHILS: 1 % (ref 0–5)
HCT: 27.4 % — ABNORMAL LOW (ref 36.8–45.2)
HGB: 9.4 g/dL — ABNORMAL LOW (ref 12.8–15.0)
IMMATURE GRANULOCYTES: 1 % (ref 0.0–5.0)
LYMPHOCYTES: 16 % (ref 16–40)
MCH: 28.7 PG (ref 27–31)
MCHC: 34.3 g/dL (ref 32–36)
MCV: 83.8 FL (ref 81–99)
MONOCYTES: 15 % — ABNORMAL HIGH (ref 0–12)
MPV: 9.4 FL (ref 7.4–10.4)
NEUTROPHILS: 68 % (ref 40–70)
PLATELET: 423 10*3/uL (ref 140–450)
RBC: 3.27 M/uL — ABNORMAL LOW (ref 4.0–5.2)
RDW: 13.4 % (ref 11.5–14.5)
WBC: 8.9 10*3/uL (ref 4.8–10.8)

## 2017-03-09 LAB — METABOLIC PANEL, COMPREHENSIVE
A-G Ratio: 0.4 — ABNORMAL LOW (ref 1.0–3.1)
ALT (SGPT): 122 U/L — ABNORMAL HIGH (ref 12.0–78.0)
AST (SGOT): 97 U/L — ABNORMAL HIGH (ref 15–37)
Albumin: 1.6 g/dL — ABNORMAL LOW (ref 3.40–5.00)
Alk. phosphatase: 173 U/L — ABNORMAL HIGH (ref 46–116)
Anion gap: 12 mmol/L (ref 10–17)
BUN/Creatinine ratio: 8 (ref 6.0–20.0)
BUN: 7 MG/DL (ref 7–18)
Bilirubin, total: 1 MG/DL (ref 0.20–1.00)
CO2: 29 mmol/L (ref 21–32)
Calcium: 7.7 MG/DL — ABNORMAL LOW (ref 8.5–10.1)
Chloride: 101 mmol/L (ref 98–107)
Creatinine: 0.85 MG/DL (ref 0.6–1.3)
GFR est AA: >60 mL/min/{1.73_m2} (ref >60)
GFR est non-AA: >60 mL/min/{1.73_m2} (ref >60)
Globulin: 4 g/dL
Glucose: 114 mg/dL — ABNORMAL HIGH (ref 74–106)
Potassium: 4.5 mmol/L (ref 3.50–5.10)
Protein, total: 5.6 g/dL — ABNORMAL LOW (ref 6.40–8.20)
Sodium: 137 mmol/L (ref 136–145)

## 2017-03-09 LAB — LACTIC ACID: Lactic acid: 1.1 MMOL/L (ref 0.4–2.0)

## 2017-03-09 LAB — MAGNESIUM: Magnesium: 1.8 mg/dL (ref 1.8–2.4)

## 2017-03-09 MED FILL — CLONIDINE 0.1 MG TAB: 0.1 mg | ORAL | Qty: 1

## 2017-03-09 MED FILL — ZYVOX 600 MG/300 ML INTRAVENOUS PIGGYBACK: 600 mg/300 mL | INTRAVENOUS | Qty: 300

## 2017-03-09 MED FILL — ENOXAPARIN 40 MG/0.4 ML SUB-Q SYRINGE: 40 mg/0.4 mL | SUBCUTANEOUS | Qty: 0.4

## 2017-03-09 MED FILL — CLINDAMYCIN 150 MG CAP: 150 mg | ORAL | Qty: 2

## 2017-03-09 MED FILL — TYLENOL 325 MG TABLET: 325 mg | ORAL | Qty: 2

## 2017-03-09 MED FILL — DEXTROSE 5% IN NORMAL SALINE IV: INTRAVENOUS | Qty: 1000

## 2017-03-09 MED FILL — FAMOTIDINE 20 MG TAB: 20 mg | ORAL | Qty: 1

## 2017-03-09 MED FILL — OXYCODONE 10 MG TAB: 10 mg | ORAL | Qty: 2

## 2017-03-09 MED FILL — SENNA LAX 8.6 MG TABLET: 8.6 mg | ORAL | Qty: 1

## 2017-03-09 NOTE — Progress Notes (Signed)
Problem: Risk for Spread of Infection  Goal: Prevent transmission of infectious organism to others  Prevent the transmission of infectious organisms to other patients and staff members.   Outcome: Progressing Towards Goal  Proper isolation precaution placed on patient's door.

## 2017-03-09 NOTE — Progress Notes (Signed)
S: pt c/o pain at cervical and lumbar spine region    O:  Pt initially refused PT, but later agreed to amb in room, pt supine to sit at EOB without assistance, sit to stand with UE support, pt amb in room 30 ft X 1 with close supervision, pt transferred to chair with CS.  Pt left OOB in chair RN aware    A:  Pt tolerated tx well    P:  Continue with PT plan of care.  [phone]

## 2017-03-09 NOTE — Progress Notes (Signed)
Bedside and Verbal shift change report given to Horace RN (oncoming nurse) by Gail P Cook, RN   (offgoing nurse). Report included the following information SBAR, Kardex, Intake/Output, MAR and Recent Results.

## 2017-03-09 NOTE — Other (Signed)
The beginning of Daylight Saving Time occurred at 0200 hrs. Documentation of patient care and medications administered is done with respect to the time change.

## 2017-03-09 NOTE — Progress Notes (Signed)
Patient Active Problem List   Diagnosis Code   ??? CAP (community acquired pneumonia) J18.9   ??? HTN (hypertension) I10       Subjective: LLL pain increasing and fever is back    CT scan of chest shwed new Loculated Left Pleural effusion    Physical Examination:  Temp (24hrs), Avg:99.1 ??F (37.3 ??C), Min:98 ??F (36.7 ??C), Max:100.2 ??F (37.9 ??C)      Visit Vitals   ??? BP 126/82 (BP 1 Location: Left arm, BP Patient Position: At rest;Supine)   ??? Pulse 86   ??? Temp 98 ??F (36.7 ??C)   ??? Resp 18   ??? Ht 6\' 3"  (1.905 m)   ??? Wt 72.6 kg (160 lb)   ??? SpO2 98%   ??? BMI 20 kg/m2       GENERAL:  In no acute distress, comfortable, lying in bed.  SKIN:   Warm, moist.  Normal skin turgor.  No rash or scratch mark noted.   HEENT:   Normocephalic, Atraumatic,     Pink conjunctiva, Anicteric, EOMI,     No sinus tenderness or stuffy nose and     Clear & moist oral mucosa.  NECK: Supple, no JVD, no thyroidmegammy or lymphadenopathy.  AXILLA: No Lymphadenopathy.  LUNGS:   Symmetrical, Resonant and decrease BS BL base  CARDIAC:  S1 & S2, RRR. No murmurs, rubs or gallops heard  ABDOMEN:   Moves with respiration. Soft, NT, BS +.   INGUINAL: No hernia or lymphadenopathy.   GUS:  No supra pubic area tenderness. No CVAT.  EXTREM.:   No edema.   NEURO:   AOX3, No focal deficits..        Labs Reviewed:  Recent Labs      03/09/17   0600  03/07/17   0555   WBC  8.9  9.2   HGB  9.4*  9.7*   HCT  27.4*  28.4*   PLT  423  469*     Recent Labs      03/09/17   0600  03/07/17   0555   NA  137  139   K  4.5  4.2   CL  101  102   CO2  29  28   GLU  114*  101   BUN  7  7   CREA  0.85  0.86   CA  7.7*  7.4*   MG  1.8  1.7*   ALB  1.6*  1.5*   SGOT  97*  55*   ALT  122*  66     No components found for: GLPOC  No results for input(s): PH, PCO2, PO2, HCO3, FIO2 in the last 72 hours.  No results for input(s): INR in the last 72 hours.    No lab exists for component: INREXT, INREXT    Culture  All Micro Results     Procedure Component Value Units Date/Time     CULTURE, BLOOD [[id number]] Collected:  03/09/17 0100    Order Status:  Completed Specimen:  Whole Blood from Blood Updated:  03/09/17 0823    CULTURE, BLOOD [[id number]] Collected:  02/27/17 1830    Order Status:  Completed Specimen:  Blood from Blood Updated:  03/04/17 1017     Special Requests: NO SPECIAL REQUESTS        Culture result: NO GROWTH 5 DAYS       CULTURE, BLOOD [[id number]] Collected:  02/24/17 0430    Order Status:  Completed Specimen:  Whole Blood from Blood Updated:  03/02/17 1507     Special Requests: NO SPECIAL REQUESTS        Culture result: NO GROWTH 6 DAYS       CULTURE, BLOOD [[id number]]  (Abnormal)  (Susceptibility) Collected:  02/25/17 0900    Order Status:  Completed Specimen:  Whole Blood from Blood Updated:  02/28/17 1152     Special Requests: NO SPECIAL REQUESTS        GRAM STAIN         GRAM POS COCCI IN CLUSTERS AEROBIC BOTTLE CALLED RESULTS TO/VERIFIED READ BACK WITH NURSE TODD GREGORY AT 1255 ON 02/26/17 [id number]     Culture result:         **METHICILLIN RESISTANT STAPHYLOCOCCUS AUREUS** AEROBIC BOTTLE (A)              REMAINING BOTTLE(S) HAS/HAVE NO GROWTH SO FAR    CULTURE, RESPIRATORY/SPUTUM/BRONCH Gay Filler STAIN [[id number]]  (Abnormal)  (Susceptibility) Collected:  02/25/17 1930    Order Status:  Completed Specimen:  Sputum from Sputum Updated:  02/27/17 1232     Special Requests: NO SPECIAL REQUESTS        GRAM STAIN         MANY POLYMORPHONUCLEAR LEUKOCYTES (PMNs)              MODERATE GRAM POS COCCI IN CLUSTERS     Culture result:         HEAVY **METHICILLIN RESISTANT STAPHYLOCOCCUS AUREUS** PATIENT IS A KNOWN MRSA (A)              MODERATE ALPHA STREPTOCOCCUS, NOT S. PNEUMONIAE (A)    CULTURE, URINE [[id number]] Collected:  02/24/17 0630    Order Status:  Completed Specimen:  Urine from Foley Specimen Updated:  02/26/17 1237     Special Requests: NO SPECIAL REQUESTS        Culture result: NO GROWTH 48 HOURS             Urine  Color   Date Value Ref Range Status    02/24/2017 YELLOW   Final     Appearance   Date Value Ref Range Status   02/24/2017 CLEAR   Final     Specific gravity   Date Value Ref Range Status   02/24/2017 1.028   Final     pH (UA)   Date Value Ref Range Status   02/24/2017 5.5 5.0 - 7.0   Final     Protein   Date Value Ref Range Status   02/24/2017 100 (A) NEG mg/dL Final     Ketone   Date Value Ref Range Status   02/24/2017 TRACE (A) NEG mg/dL Final     Bilirubin   Date Value Ref Range Status   02/24/2017 NEGATIVE  NEG   Final     Blood   Date Value Ref Range Status   02/24/2017 MODERATE (A) NEG   Final     Urobilinogen   Date Value Ref Range Status   02/24/2017 1.0 0.2 - 1.0 EU/dL Final     Nitrites   Date Value Ref Range Status   02/24/2017 NEGATIVE  NEG   Final     Leukocyte Esterase   Date Value Ref Range Status   02/24/2017 NEGATIVE  NEG   Final       Radiology Studies:  Xr Chest Pa Lat    Result Date: 03/07/2017  EXAM:  XR CHEST PA LAT dated 03/07/2017 11:29 AM INDICATION:  Follow up MRSA pneumonia COMPARISON: Chest AP x-ray 03/03/17 0800 hours FINDINGS: PA and lateral radiographs of the chest.  Moderate consolidation posterior left lower lobe slightly increasing since previous examination.  Infiltrate right lower lung again noted. Sharply marginated line possible skinfold superimposed upon the right lung appearing since previous examination.  Right hemidiaphragm elevation.  Heart size is mildly enlarged.  Right-sided PICC line tip in the superior vena cava and right atrial junction in satisfactory position appearing since previous examination..     IMPRESSION: Consolidation posterior left lower lung slightly increasing since previous examination.  Right lower lung infiltrate unchanged. PICC line tip in superior vena cava and right atrial junction appearing since previous examination.     Xr Chest Pa Lat    Result Date: 03/03/2017  EXAM:  XR CHEST PA LAT dated 03/03/2017 8:12 AM INDICATION:   BL pneumonia  with pain, follow up COMPARISON: 02/26/2017. FINDINGS: PA and lateral radiographs of the chest.  There are persistent bilateral lower lobe infiltrates. There are bilateral pleural effusions.     IMPRESSION: There are persistent bilateral lower lobe infiltrates. There are bilateral pleural effusions.     Xr Chest Pa Lat    Result Date: 02/26/2017  EXAM:  XR CHEST PA LAT dated 02/26/2017 10:31 AM INDICATION:   Persistent fever suspect empyema or abscess. COMPARISON: Chest AP semierect film from 02/24/2017.Chest CT scan 02/25/17. PA and lateral radiographs of the chest.  Dense infiltrate or consolidation in the posterior right midlung adjacent to the fissure in the superior segment right lower lobe measuring approximately 8.6 x 7.3 x 7.3 cm has  increased since previous examination 02/24/17.  Infiltrate left lower lobe has increased.  Moderate consolidation left lower lobe again noted Lungs are normally inflated.  Normal heart size..  Right hilar prominence compatible with small nodes unchanged. Marland Kitchen     IMPRESSION: Increasing consolidation posterior right midlung and increasing infiltrate left lower lobe since previous examination 02/24/17.  Findings are unchanged since CT scan of 02/25/17. Dense consolidation left lower lobe measuring 4.9 cm in diameter, focal consolidation vs. Beginning of an abscess. Close follow-up examination suggested.  Some distention of colon with air unchanged.     Xr Spine Lumb Min 4 V    Result Date: 02/25/2017  EXAM:  XR SPINE LUMB MIN 4 V dated 02/25/2017 12:28 PM INDICATION:   LBP COMPARISON:  None. FINDINGS:  AP, lateral, and oblique views of the lumbar spine were obtained. No fracture or subluxation. No disc space narrowing. No arthritic changes. No osteolytic or osteoblastic lesion. SI joints are unremarkable. There is opacification of both renal collecting system and ureters from the intravenous contrast injection of the prior CT.     IMPRESSION:   Normal exam.     Ct Chest Wo Cont     Result Date: 03/09/2017  EXAM: CT CHEST WO CONT dated 03/09/2017 11:33 AM INDICATION:  Follow up CT and fever and LLL pain persisted COMPARISON:  02/28/2017 TECHNIQUE: Chest CT without contrast 2.5 mm axial images with sagittal and coronal reconstructions produced. One or more of the following dose reduction techniques were used: automated exposure control, adjustment of the mA and/or kV according to patient size, use of iterative reconstruction technique. FINDINGS:  Limited evaluation of solid viscera, mediastinum and vessels due to the lack of contrast. Included thyroid appears homogenous. Right-sided vascular catheter with tip in SVC. Heart is nonenlarged. Trace nonspecific pericardial effusion. Minimal vascular calcifications. Cannot adequately evaluate the pulmonary arteries. Slightly prominent mediastinal lymph nodes  may be reactive. Apical interstitial disease with honeycombing, septal thickening and subpleural cysts. Similar appearance on prior. Redemonstrated is extensive consolidation within the lower lobes bilaterally with scattered pneumatoceles. Overall slight improvement since prior. Tiny right pleural effusion is new. Moderate left pleural effusion with loculated components extending into the left upper lobe and major fissure. Limited evaluation of the upper abdomen demonstrates no interval change. Somewhat dense appearance of the bones. This is nonspecific but may be seen with underlying metabolic disease     IMPRESSION:  1.  Heart is not enlarged. Stable small pericardial effusion. 2.  Redemonstrated is extensive consolidation within the lower lobes bilaterally with scattered pneumatoceles. Overall slight improvement since prior. 3.  Tiny right pleural effusion is new. 4.  There a larger moderate left pleural effusion with loculated components extending into the major fissure and left upper lobe. Probably infective or exudative. 5.  Remainder of the exam is unchanged. 6.  See discussion above      Ct Chest Wo Cont    Result Date: 02/28/2017  EXAM:  CT CHEST WO CONT  dated 02/28/2017 2:38 PM INDICATION:  MRSA pneumonia worsening suspect abscess formation COMPARISON: CT scan the chest with contrast injection 02/25/17.  CONTRAST:  None. TECHNIQUE: Spiral CT through the thorax without contrast was performed. 2.5 mm axial images with sagittal and coronal reconstructions produced. CT dose reduction was achieved through use of a standardized protocol tailored for this examination and automatic exposure control for dose modulation. The absence of intravenous contrast prevents evaluation for pulmonary emboli or aortic dissection. FINDINGS:Small bulla in the left and right upper lobes.  Focal infiltrate in the anterior part of the left upper lobe unchanged since the previous examination. Bilateral basilar consolidation again noted.  On the right side it measures approximately 10 cm in craniocaudal dimension unchanged since the previous examination.  On the left side it measures approximately 10 cm , increasing from approximately 9 cm on the previous study.  No significant pleural effusion or empyema detected at this time.  No cavitary lesions other than small bulla within the lung fields.\\ 7.1 mm anterior inferior pericardial effusion again noted.  Right hemidiaphragm elevation.  Heart size is normal.     IMPRESSION: Bibasilar consolidation in the lung fields left side slightly larger again noted with small infiltrate left upper lobe and bullous changes the left and right lung apices. No cavitary lesion or loculated pleural effusion detected on the current examination.  Trace pericardial effusion measuring 7.1 mm appearing since previous examination.  Right hemidiaphragm elevation.     Ct Chest W Cont    Result Date: 02/25/2017  CT CHEST W CONT dated 02/25/2017 12:19 PM INDICATION:  Pneumonia, Hematemesis COMPARISON: None TECHNIQUE: Spiral CT with IV contrast bolus  through the thorax was performed. 2.5 mm axial images with sagittal and coronal reconstructions produced. Contrast material 100 mL Isovue 300. One or more of the following dose reduction techniques were used: automated exposure control, adjustment of the mA and/or kV according to patient size, use of iterative reconstruction technique. INTERPRETATION:  There is less than optimal opacification of the pulmonary arterial tree. No definite pulmonary emboli can be seen. The aorta is unremarkable. There are dense consolidative infiltrates in both lower lobes, and there is patchy infiltration in the left upper lobe. No definite pleural effusion is seen. Multiple air bronchograms are seen in the lower lobes. No hilar or mediastinal mass is identified. The tracheobronchial tree is unremarkable. The thyroid is unremarkable. No cervical lymphadenopathy is  seen. No chest wall abnormality is observed. Limited images the upper abdomen show no significant abnormality. The visualized skeletal structures are unremarkable.     IMPRESSION: 1.  Dense bilateral lower lobe infiltrates consistent with pneumonia. There is also patchy infiltration in the left upper lobe. 2.  Suboptimal opacification of the pulmonary arterial tree. No gross pulmonary embolic disease is observed.    Xr Chest Port    Result Date: 02/24/2017  EXAM:  XR CHEST PORT dated 02/24/2017 8:31 AM INDICATION:  pre op. Nurses note states patient coughing up blood-tinged sputum. COMPARISON:  None. FINDINGS: A portable AP semierect radiograph of the chest was obtained at 0819 hours. There are bilateral lower lung zone infiltrates slightly more prominent on the left than the right. There is no obscuration of the diaphragm or of the heart border, and localization of the infiltrates is difficult. Cannot rule out overlying pleural disease. The upper lung zones are clear. Heart size is normal. No hilar or mediastinal mass.      IMPRESSION:  Bilateral lower lung zone densities consistent with bilateral infiltrates. Consider conventional PA and lateral views for better characterization.     Assessment: Left new loculated pleural effusion, suspect empyema    Today's lab was noted.    Plan:  Continue current meds    Monitor culture, fluid and electrolyte.    Accepted at [facility] but no bed is available    Will tryt to get Pigtail drain placed in AM.   `

## 2017-03-09 NOTE — Other (Signed)
Bedside and Verbal shift change report given to April RN (oncoming nurse) by Horace RN (offgoing nurse). Report included the following information SBAR, Kardex and MAR.

## 2017-03-10 MED FILL — ZYVOX 600 MG/300 ML INTRAVENOUS PIGGYBACK: 600 mg/300 mL | INTRAVENOUS | Qty: 300

## 2017-03-10 MED FILL — CLINDAMYCIN 150 MG CAP: 150 mg | ORAL | Qty: 2

## 2017-03-10 MED FILL — FAMOTIDINE 20 MG TAB: 20 mg | ORAL | Qty: 1

## 2017-03-10 MED FILL — OXYCODONE 10 MG TAB: 10 mg | ORAL | Qty: 2

## 2017-03-10 MED FILL — ENOXAPARIN 40 MG/0.4 ML SUB-Q SYRINGE: 40 mg/0.4 mL | SUBCUTANEOUS | Qty: 0.4

## 2017-03-10 MED FILL — CLONIDINE 0.1 MG TAB: 0.1 mg | ORAL | Qty: 1

## 2017-03-10 MED FILL — DEXTROSE 5% IN NORMAL SALINE IV: INTRAVENOUS | Qty: 1000

## 2017-03-10 MED FILL — OXYCODONE-ACETAMINOPHEN 5 MG-325 MG TAB: 5-325 mg | ORAL | Qty: 2

## 2017-03-10 NOTE — Progress Notes (Signed)
Patient Active Problem List   Diagnosis Code   ??? CAP (community acquired pneumonia) J18.9   ??? HTN (hypertension) I10       Subjective: Very upset with me because I could not help him to communicate with his family and referred his issue to security personal and was very loud and inappropriate to me.    Pigtail drain could not be placed today and the IR specialist will come here on Wednesday.     Physical Examination:  Temp (24hrs), Avg:98.9 ??F (37.2 ??C), Min:97.9 ??F (36.6 ??C), Max:100.5 ??F (38.1 ??C)      Visit Vitals   ??? BP 113/73 (BP 1 Location: Left arm, BP Patient Position: At rest)   ??? Pulse 96   ??? Temp (!) 100.5 ??F (38.1 ??C)   ??? Resp 16   ??? Ht 6\' 3"  (1.905 m)   ??? Wt 72.6 kg (160 lb)   ??? SpO2 92%   ??? BMI 20 kg/m2       GENERAL:  In no acute distress, comfortable, lying in bed.  SKIN:   Warm, moist.  Normal skin turgor.  No rash or scratch mark noted.   HEENT:   Normocephalic, Atraumatic,     Pink conjunctiva, Anicteric, EOMI,     No sinus tenderness or stuffy nose and     Clear & moist oral mucosa.  NECK: Supple, no JVD, no thyroidmegammy or lymphadenopathy.  AXILLA: No Lymphadenopathy.  LUNGS:   Symmetrical, Resonant and decrease BS BL Base.  CARDIAC:  S1 & S2, RRR. No murmurs, rubs or gallops heard  ABDOMEN:   Moves with respiration. Soft, NT, BS +.   INGUINAL: No hernia or lymphadenopathy.   GUS:  No supra pubic area tenderness. No CVAT.  EXTREM.:   No edema.   NEURO:   AOX3, No focal deficits..        Labs Reviewed:  Recent Labs      03/09/17   0600   WBC  8.9   HGB  9.4*   HCT  27.4*   PLT  423     Recent Labs      03/09/17   0600   NA  137   K  4.5   CL  101   CO2  29   GLU  114*   BUN  7   CREA  0.85   CA  7.7*   MG  1.8   ALB  1.6*   SGOT  97*   ALT  122*     No components found for: GLPOC  No results for input(s): PH, PCO2, PO2, HCO3, FIO2 in the last 72 hours.  No results for input(s): INR in the last 72 hours.    No lab exists for component: INREXT, INREXT    Culture  All Micro Results      Procedure Component Value Units Date/Time    CULTURE, BLOOD [[id number]] Collected:  03/09/17 0100    Order Status:  Completed Specimen:  Whole Blood from Blood Updated:  03/10/17 1136     Special Requests: NO SPECIAL REQUESTS        Culture result: NO GROWTH 1 DAY       CULTURE, BLOOD [[id number]] Collected:  02/27/17 1830    Order Status:  Completed Specimen:  Blood from Blood Updated:  03/04/17 1017     Special Requests: NO SPECIAL REQUESTS        Culture result: NO GROWTH 5 DAYS  CULTURE, BLOOD [[id number]] Collected:  02/24/17 0430    Order Status:  Completed Specimen:  Whole Blood from Blood Updated:  03/02/17 1507     Special Requests: NO SPECIAL REQUESTS        Culture result: NO GROWTH 6 DAYS       CULTURE, BLOOD [[id number]]  (Abnormal)  (Susceptibility) Collected:  02/25/17 0900    Order Status:  Completed Specimen:  Whole Blood from Blood Updated:  02/28/17 1152     Special Requests: NO SPECIAL REQUESTS        GRAM STAIN         GRAM POS COCCI IN CLUSTERS AEROBIC BOTTLE CALLED RESULTS TO/VERIFIED READ BACK WITH NURSE TODD GREGORY AT 1255 ON 02/26/17 EXT.[id number]     Culture result:         **METHICILLIN RESISTANT STAPHYLOCOCCUS AUREUS** AEROBIC BOTTLE (A)              REMAINING BOTTLE(S) HAS/HAVE NO GROWTH SO FAR    CULTURE, RESPIRATORY/SPUTUM/BRONCH Gay Filler STAIN [[id number]]  (Abnormal)  (Susceptibility) Collected:  02/25/17 1930    Order Status:  Completed Specimen:  Sputum from Sputum Updated:  02/27/17 1232     Special Requests: NO SPECIAL REQUESTS        GRAM STAIN         MANY POLYMORPHONUCLEAR LEUKOCYTES (PMNs)              MODERATE GRAM POS COCCI IN CLUSTERS     Culture result:         HEAVY **METHICILLIN RESISTANT STAPHYLOCOCCUS AUREUS** PATIENT IS A KNOWN MRSA (A)              MODERATE ALPHA STREPTOCOCCUS, NOT S. PNEUMONIAE (A)    CULTURE, URINE [[id number]] Collected:  02/24/17 0630    Order Status:  Completed Specimen:  Urine from Foley Specimen Updated:  02/26/17 1237      Special Requests: NO SPECIAL REQUESTS        Culture result: NO GROWTH 48 HOURS             Urine  Color   Date Value Ref Range Status   02/24/2017 YELLOW   Final     Appearance   Date Value Ref Range Status   02/24/2017 CLEAR   Final     Specific gravity   Date Value Ref Range Status   02/24/2017 1.028   Final     pH (UA)   Date Value Ref Range Status   02/24/2017 5.5 5.0 - 7.0   Final     Protein   Date Value Ref Range Status   02/24/2017 100 (A) NEG mg/dL Final     Ketone   Date Value Ref Range Status   02/24/2017 TRACE (A) NEG mg/dL Final     Bilirubin   Date Value Ref Range Status   02/24/2017 NEGATIVE  NEG   Final     Blood   Date Value Ref Range Status   02/24/2017 MODERATE (A) NEG   Final     Urobilinogen   Date Value Ref Range Status   02/24/2017 1.0 0.2 - 1.0 EU/dL Final     Nitrites   Date Value Ref Range Status   02/24/2017 NEGATIVE  NEG   Final     Leukocyte Esterase   Date Value Ref Range Status   02/24/2017 NEGATIVE  NEG   Final       Radiology Studies:  Xr Chest Pa Lat    Result Date:  03/07/2017  EXAM:  XR CHEST PA LAT dated 03/07/2017 11:29 AM INDICATION:   Follow up MRSA pneumonia COMPARISON: Chest AP x-ray 03/03/17 0800 hours FINDINGS: PA and lateral radiographs of the chest.  Moderate consolidation posterior left lower lobe slightly increasing since previous examination.  Infiltrate right lower lung again noted. Sharply marginated line possible skinfold superimposed upon the right lung appearing since previous examination.  Right hemidiaphragm elevation.  Heart size is mildly enlarged.  Right-sided PICC line tip in the superior vena cava and right atrial junction in satisfactory position appearing since previous examination..     IMPRESSION: Consolidation posterior left lower lung slightly increasing since previous examination.  Right lower lung infiltrate unchanged. PICC line tip in superior vena cava and right atrial junction appearing since previous examination.     Xr Chest Pa Lat     Result Date: 03/03/2017  EXAM:  XR CHEST PA LAT dated 03/03/2017 8:12 AM INDICATION:   BL pneumonia with pain, follow up COMPARISON: 02/26/2017. FINDINGS: PA and lateral radiographs of the chest.  There are persistent bilateral lower lobe infiltrates. There are bilateral pleural effusions.     IMPRESSION: There are persistent bilateral lower lobe infiltrates. There are bilateral pleural effusions.     Xr Chest Pa Lat    Result Date: 02/26/2017  EXAM:  XR CHEST PA LAT dated 02/26/2017 10:31 AM INDICATION:   Persistent fever suspect empyema or abscess. COMPARISON: Chest AP semierect film from 02/24/2017.Chest CT scan 02/25/17. PA and lateral radiographs of the chest.  Dense infiltrate or consolidation in the posterior right midlung adjacent to the fissure in the superior segment right lower lobe measuring approximately 8.6 x 7.3 x 7.3 cm has  increased since previous examination 02/24/17.  Infiltrate left lower lobe has increased.  Moderate consolidation left lower lobe again noted Lungs are normally inflated.  Normal heart size..  Right hilar prominence compatible with small nodes unchanged. Marland Kitchen     IMPRESSION: Increasing consolidation posterior right midlung and increasing infiltrate left lower lobe since previous examination 02/24/17.  Findings are unchanged since CT scan of 02/25/17. Dense consolidation left lower lobe measuring 4.9 cm in diameter, focal consolidation vs. Beginning of an abscess. Close follow-up examination suggested.  Some distention of colon with air unchanged.     Xr Spine Lumb Min 4 V    Result Date: 02/25/2017  EXAM:  XR SPINE LUMB MIN 4 V dated 02/25/2017 12:28 PM INDICATION:   LBP COMPARISON:  None. FINDINGS:  AP, lateral, and oblique views of the lumbar spine were obtained. No fracture or subluxation. No disc space narrowing. No arthritic changes. No osteolytic or osteoblastic lesion. SI joints are unremarkable. There is opacification of both renal collecting system and  ureters from the intravenous contrast injection of the prior CT.     IMPRESSION:   Normal exam.     Ct Chest Wo Cont    Result Date: 03/09/2017  EXAM: CT CHEST WO CONT dated 03/09/2017 11:33 AM INDICATION:  Follow up CT and fever and LLL pain persisted COMPARISON:  02/28/2017 TECHNIQUE: Chest CT without contrast 2.5 mm axial images with sagittal and coronal reconstructions produced. One or more of the following dose reduction techniques were used: automated exposure control, adjustment of the mA and/or kV according to patient size, use of iterative reconstruction technique. FINDINGS:  Limited evaluation of solid viscera, mediastinum and vessels due to the lack of contrast. Included thyroid appears homogenous. Right-sided vascular catheter with tip in SVC. Heart is nonenlarged. Trace nonspecific pericardial  effusion. Minimal vascular calcifications. Cannot adequately evaluate the pulmonary arteries. Slightly prominent mediastinal lymph nodes may be reactive. Apical interstitial disease with honeycombing, septal thickening and subpleural cysts. Similar appearance on prior. Redemonstrated is extensive consolidation within the lower lobes bilaterally with scattered pneumatoceles. Overall slight improvement since prior. Tiny right pleural effusion is new. Moderate left pleural effusion with loculated components extending into the left upper lobe and major fissure. Limited evaluation of the upper abdomen demonstrates no interval change. Somewhat dense appearance of the bones. This is nonspecific but may be seen with underlying metabolic disease     IMPRESSION:  1.  Heart is not enlarged. Stable small pericardial effusion. 2.  Redemonstrated is extensive consolidation within the lower lobes bilaterally with scattered pneumatoceles. Overall slight improvement since prior. 3.  Tiny right pleural effusion is new. 4.  There a larger moderate left pleural effusion with loculated components extending into the major  fissure and left upper lobe. Probably infective or exudative. 5.  Remainder of the exam is unchanged. 6.  See discussion above     Ct Chest Wo Cont    Result Date: 02/28/2017  EXAM:  CT CHEST WO CONT  dated 02/28/2017 2:38 PM INDICATION:  MRSA pneumonia worsening suspect abscess formation COMPARISON: CT scan the chest with contrast injection 02/25/17.  CONTRAST:  None. TECHNIQUE: Spiral CT through the thorax without contrast was performed. 2.5 mm axial images with sagittal and coronal reconstructions produced. CT dose reduction was achieved through use of a standardized protocol tailored for this examination and automatic exposure control for dose modulation. The absence of intravenous contrast prevents evaluation for pulmonary emboli or aortic dissection. FINDINGS:Small bulla in the left and right upper lobes.  Focal infiltrate in the anterior part of the left upper lobe unchanged since the previous examination. Bilateral basilar consolidation again noted.  On the right side it measures approximately 10 cm in craniocaudal dimension unchanged since the previous examination.  On the left side it measures approximately 10 cm , increasing from approximately 9 cm on the previous study.  No significant pleural effusion or empyema detected at this time.  No cavitary lesions other than small bulla within the lung fields.\\ 7.1 mm anterior inferior pericardial effusion again noted.  Right hemidiaphragm elevation.  Heart size is normal.     IMPRESSION: Bibasilar consolidation in the lung fields left side slightly larger again noted with small infiltrate left upper lobe and bullous changes the left and right lung apices. No cavitary lesion or loculated pleural effusion detected on the current examination.  Trace pericardial effusion measuring 7.1 mm appearing since previous examination.  Right hemidiaphragm elevation.     Ct Chest W Cont    Result Date: 02/25/2017  CT CHEST W CONT dated 02/25/2017 12:19 PM INDICATION:  Pneumonia,  Hematemesis COMPARISON: None TECHNIQUE: Spiral CT with IV contrast bolus through the thorax was performed. 2.5 mm axial images with sagittal and coronal reconstructions produced. Contrast material 100 mL Isovue 300. One or more of the following dose reduction techniques were used: automated exposure control, adjustment of the mA and/or kV according to patient size, use of iterative reconstruction technique. INTERPRETATION:  There is less than optimal opacification of the pulmonary arterial tree. No definite pulmonary emboli can be seen. The aorta is unremarkable. There are dense consolidative infiltrates in both lower lobes, and there is patchy infiltration in the left upper lobe. No definite pleural effusion is seen. Multiple air bronchograms are seen in the lower lobes. No hilar or mediastinal mass  is identified. The tracheobronchial tree is unremarkable. The thyroid is unremarkable. No cervical lymphadenopathy is seen. No chest wall abnormality is observed. Limited images the upper abdomen show no significant abnormality. The visualized skeletal structures are unremarkable.     IMPRESSION: 1.  Dense bilateral lower lobe infiltrates consistent with pneumonia. There is also patchy infiltration in the left upper lobe. 2.  Suboptimal opacification of the pulmonary arterial tree. No gross pulmonary embolic disease is observed.    Xr Chest Port    Result Date: 02/24/2017  EXAM:  XR CHEST PORT dated 02/24/2017 8:31 AM INDICATION:  pre op. Nurses note states patient coughing up blood-tinged sputum. COMPARISON:  None. FINDINGS: A portable AP semierect radiograph of the chest was obtained at 0819 hours. There are bilateral lower lung zone infiltrates slightly more prominent on the left than the right. There is no obscuration of the diaphragm or of the heart border, and localization of the infiltrates is difficult. Cannot rule out overlying pleural disease. The upper lung zones  are clear. Heart size is normal. No hilar or mediastinal mass.     IMPRESSION:  Bilateral lower lung zone densities consistent with bilateral infiltrates. Consider conventional PA and lateral views for better characterization.     Assessment: Same    Today's lab was noted.    Plan:  Continue current meds    Monitor culture, fluid and electrolyte.    [facility]   `

## 2017-03-10 NOTE — Progress Notes (Signed)
Pt refused PT services this afternoon. Pt stated," No, I just got back in bed." Will f/u with pt in am.

## 2017-03-10 NOTE — Other (Signed)
Reason for Admission: Fever  Abdominal Pain  Influenza      Problem List:   Hospital Problems  Date Reviewed: 02/23/2017          Codes Class Noted POA    HTN (hypertension) ICD-10-CM: I10  ICD-9-CM: 401.9  03/02/2017 Yes        * (Principal)CAP (community acquired pneumonia) ICD-10-CM: J18.9  ICD-9-CM: 486  02/23/2017 Yes              Background:    Past Medical History: No past medical history on file.                                              Pain Assessment:  Pain Intensity 1: 5 (03/10/17 1841)  Pain Location 1: Back   Pain Intervention(s) 1: Medication (see MAR)    Patient Stated Pain Goal: 0            Time of last Intervention: 2 hours  Effectiveness of intervention: some relief   Other Actions to relieve pain: Distraction             Summary of Shift :   Bedside and Verbal shift change report given to Marylu LundJanet (Cabin crewoncoming nurse) by Revonda StandardAllison (offgoing nurse). Report included the following information SBAR, Kardex, Intake/Output, MAR, Accordion, Recent Results and Med Rec Status.

## 2017-03-10 NOTE — Progress Notes (Signed)
No changes in patients assessment. Out of bed to bathroom without assistance. Vital signs stable; afebrile. NPO since midnight.

## 2017-03-10 NOTE — Other (Signed)
Pt remains breathing easily on RA.  He is still with a productive cough and low grade temperature.  I received a call form [facility] Joni Reining(Nicole) Pt is ill on the list for transfer.  His IR procedure was cancelled until Wednesday.

## 2017-03-11 LAB — CBC WITH AUTOMATED DIFF
ABS. BASOPHILS: 0 10*3/uL (ref 0.0–0.2)
ABS. EOSINOPHILS: 0.1 10*3/uL (ref 0.0–0.7)
ABS. LYMPHOCYTES: 1.4 10*3/uL (ref 1.2–3.4)
ABS. MONOCYTES: 1.1 10*3/uL (ref 1.1–3.2)
ABS. NEUTROPHILS: 5.1 10*3/uL (ref 1.4–6.5)
BASOPHILS: 1 % (ref 0–2)
EOSINOPHILS: 1 % (ref 0–5)
HCT: 27.3 % — ABNORMAL LOW (ref 36.8–45.2)
HGB: 9.2 g/dL — ABNORMAL LOW (ref 12.8–15.0)
IMMATURE GRANULOCYTES: 1 % (ref 0.0–5.0)
LYMPHOCYTES: 18 % (ref 16–40)
MCH: 28.2 PG (ref 27–31)
MCHC: 33.7 g/dL (ref 32–36)
MCV: 83.7 FL (ref 81–99)
MONOCYTES: 14 % — ABNORMAL HIGH (ref 0–12)
MPV: 9.4 FL (ref 7.4–10.4)
NEUTROPHILS: 66 % (ref 40–70)
PLATELET: 432 10*3/uL (ref 140–450)
RBC: 3.26 M/uL — ABNORMAL LOW (ref 4.0–5.2)
RDW: 13.2 % (ref 11.5–14.5)
WBC: 7.9 10*3/uL (ref 4.8–10.8)

## 2017-03-11 LAB — METABOLIC PANEL, COMPREHENSIVE
A-G Ratio: 0.4 — ABNORMAL LOW (ref 1.0–3.1)
ALT (SGPT): 223 U/L — ABNORMAL HIGH (ref 12.0–78.0)
AST (SGOT): 177 U/L — ABNORMAL HIGH (ref 15–37)
Albumin: 1.6 g/dL — ABNORMAL LOW (ref 3.40–5.00)
Alk. phosphatase: 240 U/L — ABNORMAL HIGH (ref 46–116)
Anion gap: 13 mmol/L (ref 10–17)
BUN/Creatinine ratio: 6 (ref 6.0–20.0)
BUN: 7 MG/DL (ref 7–18)
Bilirubin, total: 0.7 MG/DL (ref 0.20–1.00)
CO2: 28 mmol/L (ref 21–32)
Calcium: 7.4 MG/DL — ABNORMAL LOW (ref 8.5–10.1)
Chloride: 100 mmol/L (ref 98–107)
Creatinine: 1.09 MG/DL (ref 0.6–1.3)
GFR est AA: >60 mL/min/{1.73_m2} (ref >60)
GFR est non-AA: >60 mL/min/{1.73_m2} (ref >60)
Globulin: 4.2 g/dL
Glucose: 104 mg/dL (ref 74–106)
Potassium: 4.8 mmol/L (ref 3.50–5.10)
Protein, total: 5.8 g/dL — ABNORMAL LOW (ref 6.40–8.20)
Sodium: 136 mmol/L (ref 136–145)

## 2017-03-11 LAB — LACTIC ACID: Lactic acid: 1.3 MMOL/L (ref 0.4–2.0)

## 2017-03-11 LAB — MAGNESIUM: Magnesium: 1.7 mg/dL — ABNORMAL LOW (ref 1.8–2.4)

## 2017-03-11 MED ORDER — MAGNESIUM OXIDE 400 MG TAB
400 mg | Freq: Two times a day (BID) | ORAL | Status: DC
Start: 2017-03-11 — End: 2017-03-12
  Administered 2017-03-11 – 2017-03-12 (×2): via ORAL

## 2017-03-11 MED ORDER — MAGNESIUM OXIDE 400 MG TAB
400 mg | Freq: Two times a day (BID) | ORAL | Status: DC
Start: 2017-03-11 — End: 2017-03-11

## 2017-03-11 MED FILL — ZYVOX 600 MG/300 ML INTRAVENOUS PIGGYBACK: 600 mg/300 mL | INTRAVENOUS | Qty: 300

## 2017-03-11 MED FILL — TYLENOL 325 MG TABLET: 325 mg | ORAL | Qty: 2

## 2017-03-11 MED FILL — FAMOTIDINE 20 MG TAB: 20 mg | ORAL | Qty: 1

## 2017-03-11 MED FILL — ENOXAPARIN 40 MG/0.4 ML SUB-Q SYRINGE: 40 mg/0.4 mL | SUBCUTANEOUS | Qty: 0.4

## 2017-03-11 MED FILL — CLINDAMYCIN 150 MG CAP: 150 mg | ORAL | Qty: 2

## 2017-03-11 MED FILL — SENNA LAX 8.6 MG TABLET: 8.6 mg | ORAL | Qty: 1

## 2017-03-11 MED FILL — OXYCODONE-ACETAMINOPHEN 5 MG-325 MG TAB: 5-325 mg | ORAL | Qty: 2

## 2017-03-11 MED FILL — OXYCODONE 10 MG TAB: 10 mg | ORAL | Qty: 2

## 2017-03-11 MED FILL — MAGNESIUM OXIDE 400 MG TAB: 400 mg | ORAL | Qty: 1

## 2017-03-11 MED FILL — DEXTROSE 5% IN NORMAL SALINE IV: INTRAVENOUS | Qty: 1000

## 2017-03-11 MED FILL — CLONIDINE 0.1 MG TAB: 0.1 mg | ORAL | Qty: 1

## 2017-03-11 NOTE — Other (Signed)
Bedside shift change report given to ALLISON VAN DE CRUIZE RN  (oncoming nurse) by Wallace CullensJANET BRILL RN  (offgoing nurse). Report included the following information SBAR, Kardex, Intake/Output, MAR, Accordion, Recent Results and Med Rec Status.

## 2017-03-11 NOTE — Progress Notes (Signed)
Problem: Falls - Risk of  Goal: *Absence of Falls  Document Schmid Fall Risk and appropriate interventions in the flowsheet.   Outcome: Progressing Towards Goal  Fall Risk Interventions:  Mobility Interventions: Communicate number of staff needed for ambulation/transfer         Medication Interventions: Teach patient to arise slowly    Elimination Interventions: Call light in reach

## 2017-03-11 NOTE — Progress Notes (Signed)
Assumed care of patient for the night shift, he is alert and oriented x4, complains of lower back pain 9/10. He has flat affect and very manipulative. Vital sign stable, assessments completed and documented. Will continue to monitor.

## 2017-03-11 NOTE — Progress Notes (Signed)
Pt is currently bathing. Will f/u with pt after lunch.

## 2017-03-11 NOTE — Progress Notes (Signed)
NUTRITION follow up:    PLAN / RECOMMENDATIONS:   1. Continue current Regular Diet + Ensure Enlive TID as tolerated.  2. Will add Ensure Clear TID.  3. Monitor electrolyte balance and maintain to within normal limits.  4. RD to follow per protocol.    GOALS:   1. Continue to consume 75% or greater of all meals and supplements over the next 7 days.    SUBJECTIVE/ OBJECTIVE:   3/13: Pt is bathroom at time of RD visit. Per discussion with nursing, pt continues to consume +/-75% of meals and supplements at this time. Pt c/o diarrhea when drinking Ensure Enlive (he reported being lactose intolerant at last visit but wished to continue Ensure [place]). Will add Ensure Clear to assess tolerance. Hypomagnesemia noted, corrected calcium of 9.48m/dl WNL. Remains on IVF. Pt currently meeting est nutritional needs.     3/6: Per discussion with pt, improved appetite, consuming +/-75% of meals and supplements at this time. Pt reports he is now eating a variety of foods (not only fruits). Pt states he does not like Magic Cups--will discontinue. Pt also reports being lactose intolerant and also cannot drink OJ or eat eggs. Pt reports last episode of emesis x 2 days ago, but no longer feels nauseous. No other GI complaints at this time. Hyponatremia resolved, all other labs WNL at this time. Continues on IVF. Pt currently meeting est nutritional needs at this time.  3/2: Pt seen today based on MD consult for general nutrition management and supplements. Per discussion with pt, poor appetite since admission and has only started eating x 2 days ago. Pt reports poor appetite PTA x 2 days, meaning pt has had <50% est energy requirements x 5 days. Pt reports wt loss r/t poor appetite but unsure of amount of wt lost. No recent measured wt on file. Pt with covers pulled to neck, unable to assess for physical signs of malnutrition, however pt with slight temporal wasting. Pt reports he only eats fruits and observed <25% of fruit platter for  lunch consumed. Pt willing to receive Ensure and Magic Cup to help meet est nutritional needs. Pt admits to now resolved diarrhea, no other GI complaints at this time. No known food allergies. No wounds noted. Hyponatremia noted. Currently on IVF.      Diet: Regular + Ensure Enlive TID  Medications: '[x]'  Reviewed (pepcid, mag-ox tablet, senna, zofran prn)  Gtts:??D5NS at 1031mhr (provides 408kcals/day)  Labs:  '[x]'  Reviewed     Lab Results   Component Value Date/Time    Sodium 136 03/11/2017 04:30 AM    Potassium 4.8 03/11/2017 04:30 AM    Chloride 100 03/11/2017 04:30 AM    CO2 28 03/11/2017 04:30 AM    Glucose 104 03/11/2017 04:30 AM    BUN 7 03/11/2017 04:30 AM    Creatinine 1.09 03/11/2017 04:30 AM    Calcium 7.4 (L) 03/11/2017 04:30 AM    Magnesium 1.7 (L) 03/11/2017 04:30 AM    Phosphorus 3.2 03/04/2017 09:35 AM    Albumin 1.6 (L) 03/11/2017 04:30 AM         Intake/Output Summary (Last 24 hours) at 03/11/17 1122  Last data filed at 03/11/17 0836   Gross per 24 hour   Intake          3581.67 ml   Output             1300 ml   Net          2281.67 ml  Anthropometric   Assessment  Height: '6\' 3"'  (190.5 cm)  Weight: 72.6 kg (160 lb)  Weight Source: Patient stated  BMI (calculated): 20  Usual Body Weight: 72.6 kg (160 lb)  Last 3 Recorded Weights in this Encounter    02/23/17 2117   Weight: 72.6 kg (160 lb)        Estimated Nutrition Needs:  '[x]'  MSJ  '[]'  [address] 2003b '[]'  Other ____________  7616 Kcals/day (1.2-1.3 stress factor= 2072-2245kcals/day), Protein (g):  (0.8-1gm/kg= 58-73gm/day) Fluid (ml):  (46m/kcal= 2072-22410mday)  Based on:   '[x]'   Actual BW (72.6kg)   '[]'   IBW   '[]'  Adjusted BW      ASSESSMENT:     Nutrition Diagnoses:   No nutritional dx at this time.    Nutrition Interventions:  Intervention :Food/Nutrient Delivery: Yes  Meals/Snacks: General/healthful diet  Supplements: Commercial supplement  Recommended Diet/Supplements: Continue current diet      Education needs:    '[x]'   No educational needs identified    '[]'   The following education needs were addressed: _____________   '[x]'   No cultural, religious, or ethnic dietary needs identified    '[]'   Cultural, religious and ethnic food preferences identified and addressed    '[x]'   Participated in care plan, discharge planning/Interdisciplinary rounds. Discharge plan: ___General healthy diet_____     Nutrition Monitoring and Evaluation (for follow up notes only):  Previous Recommendations: '[x]'   Implemented  '[]'  Not Implemented (RD to address) '[]'  Not applicable   Previous Goal:  '[x]'   Met  '[]'   Not Met (RD to address)'[]'  Not applicable       Nutrition Risk:  '[]'    High     '[x]'   Moderate    '[]'   Minimal/Uncompromised    ThBabs BertinRD, LDN  EXT [id number] [phone]

## 2017-03-11 NOTE — Other (Signed)
Pt  Continues to receive IVF as per order. He is till coughing intermittently and remains on RA.  He is tolerating his diet and is noted to have a better appetite.

## 2017-03-11 NOTE — Other (Signed)
PT REMAIN ALERT AND ORIENTED X3. DID HAVE A LOW GRADE TEMP DURING THE NIGHT, 100.5 AND 100.6. PAIN MEDICATION GIVEN Q4 HRS.  PT STATED HE HAS PAIN WHEN HE COUGH. REMAIN ON IVF AND IV ANTIBIOTICS DURING THE NIGHT.

## 2017-03-11 NOTE — Other (Signed)
Utilization Review         ?? Pneumonia, Community Acquired - Care Day 15 (03/09/2017) by Zelphia Cairo, RN  ??   ?? Review Status Review Entered ??   ?? Completed 03/10/2017 ??   ?? Details ??   ??  ??   ?? Care Day: 15 Care Date: 03/09/2017 Level of Care: Telemetry ??   ?? Guideline Day 3  ??   ?? Clinical Status ??   ?? (X) * Hemodynamic stability ??   ?? 03/10/2017 4:59 PM EDT by Lajuana Matte ??   ?? ?? 100.2 111/74 89 18 100% ra ??tmax 99.8 ??   ??  ??   ?? ( ) * Afebrile, or temperature acceptable for next level of care ??   ?? ( ) * Tachypnea absent ??   ?? ( ) * Mental status at baseline ??   ?? ( ) * Antibiotic regimen acceptable for next level of care ??   ?? ( ) * Discharge plans and education understood ??   ??  ??   ?? Activity ??   ?? ( ) * Ambulatory ??   ??  ??   ?? Routes ??   ?? ( ) * Oral hydration, medications, and diet ??   ??  ??   ?? Interventions ??   ?? ( ) * Oxygen absent or at baseline need ??   ??  ??   ?? Medications ??   ?? (X) Oral antibiotics ??   ?? 03/10/2017 4:59 PM EDT by Lajuana Matte ??   ?? ?? cleocin 300 mg q6h ??   ??  ??   ??  ??   ?? 03/10/2017 4:59 PM EDT by Lajuana Matte ??   ?? Subject: Additional Clinical Information ??   ?? cntact isol mrsa po meds ivf @ 100 iv zyvox 600 mg q12h pt initially refused PT but later agreed to amb in room 30 feet x1 cont PT LLL pain increasing po pain meds ct scan of chest showed new loculated left pleural effusion alb 1.6 cont to monitor cont ivf iv abx will try to get a pigtail drain placed in aM accepted at [facility] but no bed is avail ??   ??  ??   ??  ??   ??  ??   ??  ??   ?? * Milestone ??   ??  ??   ??   ?? Pneumonia, Community Acquired - Care Day 14 (03/08/2017) by Zelphia Cairo, RN  ??   ?? Review Status Review Entered ??   ?? Completed 03/10/2017 ??   ?? Details ??   ??  ??   ?? Care Day: 14 Care Date: 03/08/2017 Level of Care: Telemetry ??   ?? Guideline Day 3  ??   ?? Clinical Status ??   ?? (X) * Hemodynamic stability ??   ?? 03/10/2017 4:54 PM EDT by Lajuana Matte ??   ?? ?? 117/73 94 99.2 20 96% 2l nc ??   ??  ??    ?? ( ) * Afebrile, or temperature acceptable for next level of care ??   ?? ( ) * Tachypnea absent ??   ?? ( ) * Mental status at baseline ??   ?? ( ) * Antibiotic regimen acceptable for next level of care ??   ?? ( ) * Discharge plans and education understood ??   ??  ??   ?? Activity ??   ?? ( ) *  Ambulatory ??   ??  ??   ?? Routes ??   ?? ( ) * Oral hydration, medications, and diet ??   ??  ??   ?? Interventions ??   ?? ( ) * Oxygen absent or at baseline need ??   ??  ??   ?? Medications ??   ?? (X) Oral antibiotics ??   ?? 03/10/2017 4:54 PM EDT by Lajuana MatteKauffman, Teresa ??   ?? ?? cleocin 300 mg q6h ??   ??  ??   ??  ??   ?? 03/10/2017 4:54 PM EDT by Lajuana MatteKauffman, Teresa ??   ?? Subject: Additional Clinical Information ??   ?? feeling better and stronger PT in progress fever is back and LLL got worse specially on moving tmax 102.3 ivf @ 100 lovenox 40 mg sq q24h iv zyvox 600 mg q12hcont current meds monitor culture ivf iv abx ct of chest in am ??   ??  ??   ??  ??   ??  ??   ??  ??   ?? * Milestone ??   ??  ??   ??   ?? Pneumonia, Community Acquired - Care Day 13 (03/07/2017) by Zelphia Cairoeresa L Kauffman, RN  ??   ?? Review Status Review Entered ??   ?? Completed 03/10/2017 ??   ?? Details ??   ??  ??   ?? Care Day: 13 Care Date: 03/07/2017 Level of Care: Telemetry ??   ?? Guideline Day 3  ??   ?? Clinical Status ??   ?? (X) * Hemodynamic stability ??   ?? 03/10/2017 4:49 PM EDT by Lajuana MatteKauffman, Teresa ??   ?? ?? 124/78 89 98.9 18 95% ??   ??  ??   ?? ( ) * Afebrile, or temperature acceptable for next level of care ??   ?? ( ) * Tachypnea absent ??   ?? ( ) * Mental status at baseline ??   ?? ( ) * Antibiotic regimen acceptable for next level of care ??   ?? ( ) * Discharge plans and education understood ??   ??  ??   ?? Activity ??   ?? ( ) * Ambulatory ??   ??  ??   ?? Routes ??   ?? ( ) * Oral hydration, medications, and diet ??   ??  ??   ?? Interventions ??   ?? ( ) * Oxygen absent or at baseline need ??   ??  ??   ?? Medications ??   ?? (X) Oral antibiotics ??   ?? 03/10/2017 4:49 PM EDT by Lajuana MatteKauffman, Teresa ??   ?? ?? cleocin 300 mg q6h ??   ??  ??   ??  ??    ?? 03/10/2017 4:49 PM EDT by Lajuana MatteKauffman, Teresa ??   ?? Subject: Additional Clinical Information ??   ?? no new complaintswbc decreased to 9 k cxr worsening infiltrate left side mag 1.7 alb 1.5 contact isol mrsaivf @ 100 lovenox 40 mg sq q24h iv zyvox 600 mg q12h cont current meds monitor culture cont ivf may need to repeat ct scan if forms para penumonic effusion or empyema he may need VATS and may need to be transferred to tertiary institution ??   ??  ??   ??  ??   ??  ??   ??  ??   ?? * Milestone

## 2017-03-11 NOTE — Progress Notes (Signed)
Problem: Nutrition Deficit  Goal: *Optimize nutritional status  Outcome: Progressing Towards Goal  Pt continues to consume +/-75% of meals and supplements.

## 2017-03-11 NOTE — Progress Notes (Signed)
Patient Active Problem List   Diagnosis Code   ??? CAP (community acquired pneumonia) J18.9   ??? HTN (hypertension) I10       Subjective: No new complaints    Looks comfortable    He is not using his IS and advised to do so.    His ALT further went up    [facility] and they do not have bed and he is second on the list    Called [facility] IR and they told me that they will not put a pigtail drain and discharge a patient    Physical Examination:  Temp (24hrs), Avg:99.7 ??F (37.6 ??C), Min:97.8 ??F (36.6 ??C), Max:100.7 ??F (38.2 ??C)      Visit Vitals   ??? BP 122/82 (BP 1 Location: Left arm, BP Patient Position: At rest)   ??? Pulse 89   ??? Temp (!) 100.7 ??F (38.2 ??C)   ??? Resp 18   ??? Ht 6\' 3"  (1.905 m)   ??? Wt 72.6 kg (160 lb)   ??? SpO2 94%   ??? BMI 20 kg/m2       GENERAL:  In no acute distress, comfortable, lying in bed.  SKIN:   Warm, moist.  Normal skin turgor.  No rash or scratch mark noted.   HEENT:   Normocephalic, Atraumatic,     Pink conjunctiva, Anicteric, EOMI,     No sinus tenderness or stuffy nose and     Clear & moist oral mucosa.  NECK: Supple, no JVD, no thyroidmegammy or lymphadenopathy.  AXILLA: No Lymphadenopathy.  LUNGS:   Symmetrical, Resonant and CTA BL. No wheezes, rales,or rhonchi,  CARDIAC:  S1 & S2, RRR. No murmurs, rubs or gallops heard  ABDOMEN:   Moves with respiration. Soft, NT, BS +.   INGUINAL: No hernia or lymphadenopathy.   GUS:  No supra pubic area tenderness. No CVAT.  EXTREM.:   No edema.   NEURO:   AOX3, No focal deficits..        Labs Reviewed:  Recent Labs      03/11/17   0430  03/09/17   0600   WBC  7.9  8.9   HGB  9.2*  9.4*   HCT  27.3*  27.4*   PLT  432  423     Recent Labs      03/11/17   0430  03/09/17   0600   NA  136  137   K  4.8  4.5   CL  100  101   CO2  28  29   GLU  104  114*   BUN  7  7   CREA  1.09  0.85   CA  7.4*  7.7*   MG  1.7*  1.8   ALB  1.6*  1.6*   SGOT  177*  97*   ALT  223*  122*     No components found for: GLPOC   No results for input(s): PH, PCO2, PO2, HCO3, FIO2 in the last 72 hours.  No results for input(s): INR in the last 72 hours.    No lab exists for component: INREXT, INREXT    Culture  All Micro Results     Procedure Component Value Units Date/Time    CULTURE, BLOOD [[id number]] Collected:  03/09/17 0100    Order Status:  Completed Specimen:  Whole Blood from Blood Updated:  03/11/17 1243     Special Requests: NO SPECIAL REQUESTS  Culture result: NO GROWTH 2 DAYS       CULTURE, BLOOD [[id number]] Collected:  02/27/17 1830    Order Status:  Completed Specimen:  Blood from Blood Updated:  03/04/17 1017     Special Requests: NO SPECIAL REQUESTS        Culture result: NO GROWTH 5 DAYS       CULTURE, BLOOD [[id number]] Collected:  02/24/17 0430    Order Status:  Completed Specimen:  Whole Blood from Blood Updated:  03/02/17 1507     Special Requests: NO SPECIAL REQUESTS        Culture result: NO GROWTH 6 DAYS       CULTURE, BLOOD [[id number]]  (Abnormal)  (Susceptibility) Collected:  02/25/17 0900    Order Status:  Completed Specimen:  Whole Blood from Blood Updated:  02/28/17 1152     Special Requests: NO SPECIAL REQUESTS        GRAM STAIN         GRAM POS COCCI IN CLUSTERS AEROBIC BOTTLE CALLED RESULTS TO/VERIFIED READ BACK WITH NURSE TODD GREGORY AT 1255 ON 02/26/17 EXT.[id number]     Culture result:         **METHICILLIN RESISTANT STAPHYLOCOCCUS AUREUS** AEROBIC BOTTLE (A)              REMAINING BOTTLE(S) HAS/HAVE NO GROWTH SO FAR    CULTURE, RESPIRATORY/SPUTUM/BRONCH Gay FillerW GRAM STAIN [[id number]]  (Abnormal)  (Susceptibility) Collected:  02/25/17 1930    Order Status:  Completed Specimen:  Sputum from Sputum Updated:  02/27/17 1232     Special Requests: NO SPECIAL REQUESTS        GRAM STAIN         MANY POLYMORPHONUCLEAR LEUKOCYTES (PMNs)              MODERATE GRAM POS COCCI IN CLUSTERS     Culture result:         HEAVY **METHICILLIN RESISTANT STAPHYLOCOCCUS AUREUS** PATIENT IS A KNOWN MRSA (A)               MODERATE ALPHA STREPTOCOCCUS, NOT S. PNEUMONIAE (A)    CULTURE, URINE [[id number]] Collected:  02/24/17 0630    Order Status:  Completed Specimen:  Urine from Foley Specimen Updated:  02/26/17 1237     Special Requests: NO SPECIAL REQUESTS        Culture result: NO GROWTH 48 HOURS             Urine  Color   Date Value Ref Range Status   02/24/2017 YELLOW   Final     Appearance   Date Value Ref Range Status   02/24/2017 CLEAR   Final     Specific gravity   Date Value Ref Range Status   02/24/2017 1.028   Final     pH (UA)   Date Value Ref Range Status   02/24/2017 5.5 5.0 - 7.0   Final     Protein   Date Value Ref Range Status   02/24/2017 100 (A) NEG mg/dL Final     Ketone   Date Value Ref Range Status   02/24/2017 TRACE (A) NEG mg/dL Final     Bilirubin   Date Value Ref Range Status   02/24/2017 NEGATIVE  NEG   Final     Blood   Date Value Ref Range Status   02/24/2017 MODERATE (A) NEG   Final     Urobilinogen   Date Value Ref Range Status   02/24/2017 1.0 0.2 - 1.0  EU/dL Final     Nitrites   Date Value Ref Range Status   02/24/2017 NEGATIVE  NEG   Final     Leukocyte Esterase   Date Value Ref Range Status   02/24/2017 NEGATIVE  NEG   Final       Radiology Studies:  Xr Chest Pa Lat    Result Date: 03/07/2017  EXAM:  XR CHEST PA LAT dated 03/07/2017 11:29 AM INDICATION:   Follow up MRSA pneumonia COMPARISON: Chest AP x-ray 03/03/17 0800 hours FINDINGS: PA and lateral radiographs of the chest.  Moderate consolidation posterior left lower lobe slightly increasing since previous examination.  Infiltrate right lower lung again noted. Sharply marginated line possible skinfold superimposed upon the right lung appearing since previous examination.  Right hemidiaphragm elevation.  Heart size is mildly enlarged.  Right-sided PICC line tip in the superior vena cava and right atrial junction in satisfactory position appearing since previous examination..     IMPRESSION: Consolidation posterior left lower lung slightly increasing  since previous examination.  Right lower lung infiltrate unchanged. PICC line tip in superior vena cava and right atrial junction appearing since previous examination.     Xr Chest Pa Lat    Result Date: 03/03/2017  EXAM:  XR CHEST PA LAT dated 03/03/2017 8:12 AM INDICATION:   BL pneumonia with pain, follow up COMPARISON: 02/26/2017. FINDINGS: PA and lateral radiographs of the chest.  There are persistent bilateral lower lobe infiltrates. There are bilateral pleural effusions.     IMPRESSION: There are persistent bilateral lower lobe infiltrates. There are bilateral pleural effusions.     Xr Chest Pa Lat    Result Date: 02/26/2017  EXAM:  XR CHEST PA LAT dated 02/26/2017 10:31 AM INDICATION:   Persistent fever suspect empyema or abscess. COMPARISON: Chest AP semierect film from 02/24/2017.Chest CT scan 02/25/17. PA and lateral radiographs of the chest.  Dense infiltrate or consolidation in the posterior right midlung adjacent to the fissure in the superior segment right lower lobe measuring approximately 8.6 x 7.3 x 7.3 cm has  increased since previous examination 02/24/17.  Infiltrate left lower lobe has increased.  Moderate consolidation left lower lobe again noted Lungs are normally inflated.  Normal heart size..  Right hilar prominence compatible with small nodes unchanged. Marland Kitchen     IMPRESSION: Increasing consolidation posterior right midlung and increasing infiltrate left lower lobe since previous examination 02/24/17.  Findings are unchanged since CT scan of 02/25/17. Dense consolidation left lower lobe measuring 4.9 cm in diameter, focal consolidation vs. Beginning of an abscess. Close follow-up examination suggested.  Some distention of colon with air unchanged.     Xr Spine Lumb Min 4 V    Result Date: 02/25/2017  EXAM:  XR SPINE LUMB MIN 4 V dated 02/25/2017 12:28 PM INDICATION:   LBP COMPARISON:  None. FINDINGS:  AP, lateral, and oblique views of the lumbar  spine were obtained. No fracture or subluxation. No disc space narrowing. No arthritic changes. No osteolytic or osteoblastic lesion. SI joints are unremarkable. There is opacification of both renal collecting system and ureters from the intravenous contrast injection of the prior CT.     IMPRESSION:   Normal exam.     Ct Chest Wo Cont    Result Date: 03/09/2017  EXAM: CT CHEST WO CONT dated 03/09/2017 11:33 AM INDICATION:  Follow up CT and fever and LLL pain persisted COMPARISON:  02/28/2017 TECHNIQUE: Chest CT without contrast 2.5 mm axial images with sagittal and coronal reconstructions  produced. One or more of the following dose reduction techniques were used: automated exposure control, adjustment of the mA and/or kV according to patient size, use of iterative reconstruction technique. FINDINGS:  Limited evaluation of solid viscera, mediastinum and vessels due to the lack of contrast. Included thyroid appears homogenous. Right-sided vascular catheter with tip in SVC. Heart is nonenlarged. Trace nonspecific pericardial effusion. Minimal vascular calcifications. Cannot adequately evaluate the pulmonary arteries. Slightly prominent mediastinal lymph nodes may be reactive. Apical interstitial disease with honeycombing, septal thickening and subpleural cysts. Similar appearance on prior. Redemonstrated is extensive consolidation within the lower lobes bilaterally with scattered pneumatoceles. Overall slight improvement since prior. Tiny right pleural effusion is new. Moderate left pleural effusion with loculated components extending into the left upper lobe and major fissure. Limited evaluation of the upper abdomen demonstrates no interval change. Somewhat dense appearance of the bones. This is nonspecific but may be seen with underlying metabolic disease     IMPRESSION:  1.  Heart is not enlarged. Stable small pericardial effusion. 2.  Redemonstrated is extensive consolidation within the lower lobes  bilaterally with scattered pneumatoceles. Overall slight improvement since prior. 3.  Tiny right pleural effusion is new. 4.  There a larger moderate left pleural effusion with loculated components extending into the major fissure and left upper lobe. Probably infective or exudative. 5.  Remainder of the exam is unchanged. 6.  See discussion above     Ct Chest Wo Cont    Result Date: 02/28/2017  EXAM:  CT CHEST WO CONT  dated 02/28/2017 2:38 PM INDICATION:  MRSA pneumonia worsening suspect abscess formation COMPARISON: CT scan the chest with contrast injection 02/25/17.  CONTRAST:  None. TECHNIQUE: Spiral CT through the thorax without contrast was performed. 2.5 mm axial images with sagittal and coronal reconstructions produced. CT dose reduction was achieved through use of a standardized protocol tailored for this examination and automatic exposure control for dose modulation. The absence of intravenous contrast prevents evaluation for pulmonary emboli or aortic dissection. FINDINGS:Small bulla in the left and right upper lobes.  Focal infiltrate in the anterior part of the left upper lobe unchanged since the previous examination. Bilateral basilar consolidation again noted.  On the right side it measures approximately 10 cm in craniocaudal dimension unchanged since the previous examination.  On the left side it measures approximately 10 cm , increasing from approximately 9 cm on the previous study.  No significant pleural effusion or empyema detected at this time.  No cavitary lesions other than small bulla within the lung fields.\\ 7.1 mm anterior inferior pericardial effusion again noted.  Right hemidiaphragm elevation.  Heart size is normal.     IMPRESSION: Bibasilar consolidation in the lung fields left side slightly larger again noted with small infiltrate left upper lobe and bullous changes the left and right lung apices. No cavitary lesion or loculated  pleural effusion detected on the current examination.  Trace pericardial effusion measuring 7.1 mm appearing since previous examination.  Right hemidiaphragm elevation.     Ct Chest W Cont    Result Date: 02/25/2017  CT CHEST W CONT dated 02/25/2017 12:19 PM INDICATION:  Pneumonia, Hematemesis COMPARISON: None TECHNIQUE: Spiral CT with IV contrast bolus through the thorax was performed. 2.5 mm axial images with sagittal and coronal reconstructions produced. Contrast material 100 mL Isovue 300. One or more of the following dose reduction techniques were used: automated exposure control, adjustment of the mA and/or kV according to patient size, use of iterative reconstruction technique.  INTERPRETATION:  There is less than optimal opacification of the pulmonary arterial tree. No definite pulmonary emboli can be seen. The aorta is unremarkable. There are dense consolidative infiltrates in both lower lobes, and there is patchy infiltration in the left upper lobe. No definite pleural effusion is seen. Multiple air bronchograms are seen in the lower lobes. No hilar or mediastinal mass is identified. The tracheobronchial tree is unremarkable. The thyroid is unremarkable. No cervical lymphadenopathy is seen. No chest wall abnormality is observed. Limited images the upper abdomen show no significant abnormality. The visualized skeletal structures are unremarkable.     IMPRESSION: 1.  Dense bilateral lower lobe infiltrates consistent with pneumonia. There is also patchy infiltration in the left upper lobe. 2.  Suboptimal opacification of the pulmonary arterial tree. No gross pulmonary embolic disease is observed.    Xr Chest Port    Result Date: 02/24/2017  EXAM:  XR CHEST PORT dated 02/24/2017 8:31 AM INDICATION:  pre op. Nurses note states patient coughing up blood-tinged sputum. COMPARISON:  None. FINDINGS: A portable AP semierect radiograph of the chest was obtained at  0819 hours. There are bilateral lower lung zone infiltrates slightly more prominent on the left than the right. There is no obscuration of the diaphragm or of the heart border, and localization of the infiltrates is difficult. Cannot rule out overlying pleural disease. The upper lung zones are clear. Heart size is normal. No hilar or mediastinal mass.     IMPRESSION:  Bilateral lower lung zone densities consistent with bilateral infiltrates. Consider conventional PA and lateral views for better characterization.     Assessment: ISame    Today's lab was noted.    Plan:  Continue current meds    Monitor culture, fluid and electrolyte.    Will repeat chest x-ray in AM    Discontinue Clindamycin   `

## 2017-03-11 NOTE — Other (Signed)
Utilization Review         ?? Pneumonia, Community Acquired - Care Day 16 (03/10/2017) by Christene Lye  ??   ?? Review Status Review Entered ??   ?? Completed 03/11/2017 ??   ?? Details ??   ??  ??   ?? Care Day: 16 Care Date: 03/10/2017 Level of Care: Telemetry ??   ?? Guideline Day 3  ??   ?? Clinical Status ??   ?? (X) * Hemodynamic stability ??   ?? 03/11/2017 9:48 AM EDT by Christene Lye ??   ?? ?? BP 113/73, P 96, R 16, ??   ??  ??   ?? ( ) * Afebrile, or temperature acceptable for next level of care ??   ?? 03/11/2017 9:48 AM EDT by Christene Lye ??   ?? ?? T ??100.5, ??   ??  ??   ?? (X) * Tachypnea absent ??   ?? (X) * Mental status at baseline ??   ?? ( ) * Antibiotic regimen acceptable for next level of care ??   ?? ( ) * Discharge plans and education understood ??   ??  ??   ?? Activity ??   ?? (X) * Ambulatory ??   ??  ??   ?? Routes ??   ?? (X) * Oral hydration, medications, and diet ??   ?? 03/11/2017 9:48 AM EDT by Christene Lye ??   ?? ?? CATAPES PO Q 8 HRS, ??LOVENOX SC Q 24 HRS, PEPCID PO Q 12 HRS, ZYVOX IV Q 12 HRS, ROXICODONE PO Q 4 HRS PRN X3 (5X/24 HRS), PERCOCET PO Q 4 HRS PRN X2, ??   ??  ??   ??  ??   ?? Interventions ??   ?? (X) * Oxygen absent or at baseline need ??   ?? 03/11/2017 9:48 AM EDT by Christene Lye ??   ?? ?? 02 SAT 94% RA ??   ??  ??   ??  ??   ?? Medications ??   ?? (X) Oral antibiotics ??   ?? 03/11/2017 9:48 AM EDT by Christene Lye ??   ?? ?? CLEOCIN PO Q 6 HRS ??   ??  ??   ??  ??   ??  ??   ??  ??   ??  ??   ?? * [place]estone ??   ??  ??   ?? Additional Notes ??   ?? PROGRESS NOTE: ??   ?? Very upset with me because I could not help him to communicate with his family and referred his issue to security personal and was very loud and inappropriate to me. Pigtail drain could not be placed today and the IR specialist will come here on Wednesday. ??   ??  ??   ?? Assessment: Same. Today's lab was noted.  ??   ??  ??   ?? Plan: Continue current meds. Monitor culture, fluid and electrolyte. Awaiting bed availability at [facility] ??   ??    ?? Pneumonia, Community Acquired - Care Day 15 (03/09/2017) by Zelphia Cairo, RN  ??   ?? Review Status Review Entered ??   ?? Completed 03/10/2017 ??   ?? Details ??   ??  ??   ?? Care Day: 15 Care Date: 03/09/2017 Level of Care: Telemetry ??   ?? Guideline Day 3  ??   ?? Clinical Status ??   ?? (X) * Hemodynamic stability ??   ?? 03/10/2017 4:59 PM  EDT by Lajuana Matte ??   ?? ?? 100.2 111/74 89 18 100% ra ??tmax 99.8 ??   ??  ??   ?? ( ) * Afebrile, or temperature acceptable for next level of care ??   ?? ( ) * Tachypnea absent ??   ?? ( ) * Mental status at baseline ??   ?? ( ) * Antibiotic regimen acceptable for next level of care ??   ?? ( ) * Discharge plans and education understood ??   ??  ??   ?? Activity ??   ?? ( ) * Ambulatory ??   ??  ??   ?? Routes ??   ?? ( ) * Oral hydration, medications, and diet ??   ??  ??   ?? Interventions ??   ?? ( ) * Oxygen absent or at baseline need ??   ??  ??   ?? Medications ??   ?? (X) Oral antibiotics ??   ?? 03/10/2017 4:59 PM EDT by Lajuana Matte ??   ?? ?? cleocin 300 mg q6h ??   ??  ??   ??  ??   ?? 03/10/2017 4:59 PM EDT by Lajuana Matte ??   ?? Subject: Additional Clinical Information ??   ?? cntact isol mrsa po meds ivf @ 100 iv zyvox 600 mg q12h pt initially refused PT but later agreed to amb in room 30 feet x1 cont PT LLL pain increasing po pain meds ct scan of chest showed new loculated left pleural effusion alb 1.6 cont to monitor cont ivf iv abx will try to get a pigtail drain placed in aM accepted at [facility] but no bed is avail ??   ??  ??   ??  ??   ??  ??   ??  ??   ?? * Milestone ??   ??  ??   ??   ?? Pneumonia, Community Acquired - Care Day 14 (03/08/2017) by Zelphia Cairo, RN  ??   ?? Review Status Review Entered ??   ?? Completed 03/10/2017 ??   ?? Details ??   ??  ??   ?? Care Day: 14 Care Date: 03/08/2017 Level of Care: Telemetry ??   ?? Guideline Day 3  ??   ?? Clinical Status ??   ?? (X) * Hemodynamic stability ??   ?? 03/10/2017 4:54 PM EDT by Lajuana Matte ??   ?? ?? 117/73 94 99.2 20 96% 2l nc ??   ??  ??    ?? ( ) * Afebrile, or temperature acceptable for next level of care ??   ?? ( ) * Tachypnea absent ??   ?? ( ) * Mental status at baseline ??   ?? ( ) * Antibiotic regimen acceptable for next level of care ??   ?? ( ) * Discharge plans and education understood ??   ??  ??   ?? Activity ??   ?? ( ) * Ambulatory ??   ??  ??   ?? Routes ??   ?? ( ) * Oral hydration, medications, and diet ??   ??  ??   ?? Interventions ??   ?? ( ) * Oxygen absent or at baseline need ??   ??  ??   ?? Medications ??   ?? (X) Oral antibiotics ??   ?? 03/10/2017 4:54 PM EDT by Lajuana Matte ??   ?? ?? cleocin 300 mg q6h ??   ??  ??   ??  ??   ??  03/10/2017 4:54 PM EDT by Lajuana MatteKauffman, Teresa ??   ?? Subject: Additional Clinical Information ??   ?? feeling better and stronger PT in progress fever is back and LLL got worse specially on moving tmax 102.3 ivf @ 100 lovenox 40 mg sq q24h iv zyvox 600 mg q12hcont current meds monitor culture ivf iv abx ct of chest in am ??   ??  ??   ??  ??   ??  ??   ??  ??   ?? * Milestone ??   ??  ??   ??   ?? Pneumonia, Community Acquired - Care Day 13 (03/07/2017) by Zelphia Cairoeresa L Kauffman, RN  ??   ?? Review Status Review Entered ??   ?? Completed 03/10/2017 ??   ?? Details ??   ??  ??   ?? Care Day: 13 Care Date: 03/07/2017 Level of Care: Telemetry ??   ?? Guideline Day 3  ??   ?? Clinical Status ??   ?? (X) * Hemodynamic stability ??   ?? 03/10/2017 4:49 PM EDT by Lajuana MatteKauffman, Teresa ??   ?? ?? 124/78 89 98.9 18 95% ??   ??  ??   ?? ( ) * Afebrile, or temperature acceptable for next level of care ??   ?? ( ) * Tachypnea absent ??   ?? ( ) * Mental status at baseline ??   ?? ( ) * Antibiotic regimen acceptable for next level of care ??   ?? ( ) * Discharge plans and education understood ??   ??  ??   ?? Activity ??   ?? ( ) * Ambulatory ??   ??  ??   ?? Routes ??   ?? ( ) * Oral hydration, medications, and diet ??   ??  ??   ?? Interventions ??   ?? ( ) * Oxygen absent or at baseline need ??   ??  ??   ?? Medications ??   ?? (X) Oral antibiotics ??   ?? 03/10/2017 4:49 PM EDT by Lajuana MatteKauffman, Teresa ??   ?? ?? cleocin 300 mg q6h ??   ??  ??   ??  ??    ?? 03/10/2017 4:49 PM EDT by Lajuana MatteKauffman, Teresa ??   ?? Subject: Additional Clinical Information ??   ?? no new complaintswbc decreased to 9 k cxr worsening infiltrate left side mag 1.7 alb 1.5 contact isol mrsaivf @ 100 lovenox 40 mg sq q24h iv zyvox 600 mg q12h cont current meds monitor culture cont ivf may need to repeat ct scan if forms para penumonic effusion or empyema he may need VATS and may need to be transferred to tertiary institution ??   ??  ??   ??  ??   ??  ??   ??  ??   ?? * Milestone

## 2017-03-11 NOTE — Progress Notes (Signed)
Pt refused PT services today. Pt stated," It is to cold. I don't want to do anything." Will f/u in am.

## 2017-03-12 LAB — METABOLIC PANEL, COMPREHENSIVE
A-G Ratio: 0.4 — ABNORMAL LOW (ref 1.0–3.1)
ALT (SGPT): 253 U/L — ABNORMAL HIGH (ref 12.0–78.0)
AST (SGOT): 180 U/L — ABNORMAL HIGH (ref 15–37)
Albumin: 1.8 g/dL — ABNORMAL LOW (ref 3.40–5.00)
Alk. phosphatase: 289 U/L — ABNORMAL HIGH (ref 46–116)
Anion gap: 16 mmol/L (ref 10–17)
BUN/Creatinine ratio: 7 (ref 6.0–20.0)
BUN: 7 MG/DL (ref 7–18)
Bilirubin, total: 0.7 MG/DL (ref 0.20–1.00)
CO2: 26 mmol/L (ref 21–32)
Calcium: 8.2 MG/DL — ABNORMAL LOW (ref 8.5–10.1)
Chloride: 99 mmol/L (ref 98–107)
Creatinine: 1.06 MG/DL (ref 0.6–1.3)
GFR est AA: >60 mL/min/{1.73_m2} (ref >60)
GFR est non-AA: >60 mL/min/{1.73_m2} (ref >60)
Globulin: 4.7 g/dL
Glucose: 101 mg/dL (ref 74–106)
Potassium: 4.5 mmol/L (ref 3.50–5.10)
Protein, total: 6.5 g/dL (ref 6.40–8.20)
Sodium: 136 mmol/L (ref 136–145)

## 2017-03-12 LAB — CBC WITH AUTOMATED DIFF
ABS. BASOPHILS: 0 10*3/uL (ref 0.0–0.2)
ABS. EOSINOPHILS: 0.1 10*3/uL (ref 0.0–0.7)
ABS. LYMPHOCYTES: 1.6 10*3/uL (ref 1.2–3.4)
ABS. MONOCYTES: 1 10*3/uL — ABNORMAL LOW (ref 1.1–3.2)
ABS. NEUTROPHILS: 5.2 10*3/uL (ref 1.4–6.5)
BASOPHILS: 0 % (ref 0–2)
EOSINOPHILS: 1 % (ref 0–5)
HCT: 28.7 % — ABNORMAL LOW (ref 36.8–45.2)
HGB: 9.7 g/dL — ABNORMAL LOW (ref 12.8–15.0)
IMMATURE GRANULOCYTES: 1 % (ref 0.0–5.0)
LYMPHOCYTES: 20 % (ref 16–40)
MCH: 28.4 PG (ref 27–31)
MCHC: 33.8 g/dL (ref 32–36)
MCV: 83.9 FL (ref 81–99)
MONOCYTES: 13 % — ABNORMAL HIGH (ref 0–12)
MPV: 9.3 FL (ref 7.4–10.4)
NEUTROPHILS: 66 % (ref 40–70)
PLATELET: 469 10*3/uL — ABNORMAL HIGH (ref 140–450)
RBC: 3.42 M/uL — ABNORMAL LOW (ref 4.0–5.2)
RDW: 13.4 % (ref 11.5–14.5)
WBC: 7.8 10*3/uL (ref 4.8–10.8)

## 2017-03-12 LAB — MAGNESIUM: Magnesium: 2.1 mg/dL (ref 1.8–2.4)

## 2017-03-12 MED FILL — CLONIDINE 0.1 MG TAB: 0.1 mg | ORAL | Qty: 1

## 2017-03-12 MED FILL — ENOXAPARIN 40 MG/0.4 ML SUB-Q SYRINGE: 40 mg/0.4 mL | SUBCUTANEOUS | Qty: 0.4

## 2017-03-12 MED FILL — FAMOTIDINE 20 MG TAB: 20 mg | ORAL | Qty: 1

## 2017-03-12 MED FILL — DEXTROSE 5% IN NORMAL SALINE IV: INTRAVENOUS | Qty: 1000

## 2017-03-12 MED FILL — ZYVOX 600 MG/300 ML INTRAVENOUS PIGGYBACK: 600 mg/300 mL | INTRAVENOUS | Qty: 300

## 2017-03-12 MED FILL — OXYCODONE 10 MG TAB: 10 mg | ORAL | Qty: 2

## 2017-03-12 MED FILL — OXYCODONE-ACETAMINOPHEN 5 MG-325 MG TAB: 5-325 mg | ORAL | Qty: 2

## 2017-03-12 MED FILL — SENNA LAX 8.6 MG TABLET: 8.6 mg | ORAL | Qty: 1

## 2017-03-12 MED FILL — MAGNESIUM OXIDE 400 MG TAB: 400 mg | ORAL | Qty: 1

## 2017-03-12 NOTE — Discharge Summary (Signed)
.  Please see trnasfer summery.

## 2017-03-12 NOTE — Progress Notes (Signed)
Upon arrival pt stated,"I don't want to do that today." after much encouragement pt cont to refused PT, will attempt to see in am

## 2017-03-12 NOTE — Progress Notes (Signed)
CD of radiology tests requested at this time. Will p/u later

## 2017-03-12 NOTE — Progress Notes (Signed)
Resting with no report of pain or SOB. Will monitor.

## 2017-03-12 NOTE — Progress Notes (Signed)
Patient demanding and manipulative. Eating. No needs. [facility] No SOB. No report of pain after meds given . Will monitor.

## 2017-03-12 NOTE — Progress Notes (Signed)
Alert and oriented x 4 with no report of pain or SOB at this time. Patient is agitated and unhappy. IVF as ordered. Will monitor.

## 2017-03-12 NOTE — Progress Notes (Signed)
[facility]. No SOB or pain reported. Patient is alert and oriented x 4. Ambulatory at this time. Right PICC intact. Will monitor.

## 2017-03-12 NOTE — Progress Notes (Signed)
Alert/oriented x4. Has put on call light every 20 minutes since 0730 hours. Patient is needy and demanding. IVF and IVABT infusing through right PICC line as ordered. Eating , drinking well with no request for pain meds. Patient does not wear nasal cannula. Refused PT today, stating " I'm too exhausted ". Will monitor.

## 2017-03-12 NOTE — Progress Notes (Signed)
[facility] Officers ordered at this time for 1600 hours. Nolon Rodmbo will be here at 1600.

## 2017-03-12 NOTE — Progress Notes (Signed)
Bedside and Verbal shift change report given to Tawanna Coolerodd, Charity fundraiserN (oncoming nurse) by Dianah FieldUche,RN (offgoing nurse). Report included the following information SBAR, Kardex, Intake/Output, MAR, Accordion and Recent Results.

## 2017-03-12 NOTE — Progress Notes (Signed)
Patient Transfer Summary    William Vega / [id number] DOB: Jun 21, 1976    Admitted 02/23/2017 Discharged: 03/12/2017     Patient Active Problem List   Diagnosis Code   ??? Influenza J11.1   ??? CAP (community acquired pneumonia) J18.9   ??? HTN (hypertension) I10   ??? 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 Empyema (HCC) J86.9   ??? MRSA bacteremia R78.81   ??? 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    Medications:     My Medications      ASK your physician about these medications       Instructions Each Dose to Equal   Morning Noon Evening Bedtime    ZANTAC 150 mg tablet   Generic drug:  raNITIdine       Your last dose was:         Your next dose is:              Take 150 mg by mouth two (2) times a day. Indications: GERD    150 mg                            Follow up:  No orders of the defined types were placed in this encounter.    Consultation:  None    Procedures Done:  INITIAL PHYSICIAN ORDER: INPATIENT  FULL CODE  VITAL SIGNS PER UNIT ROUTINE  WEIGH PATIENT  NOTIFY PROVIDER: VITAL SIGNS CHANGES  INTAKE AND OUTPUT  BEDREST, COMPLETE  AMBULATE WITH ASSISTANCE  APPLY/MAINTAIN SEQUENTIAL COMPRESSION DEVICE  BEDREST - ACTIVITY ALLOWED (PT/OT)  INSERT PERIPHERAL IV  PICC INSERTION VASCULAR ACCESS TEAM  XR CHEST PORT  CT CHEST W CONT  XR SPINE LUMB MIN 4 V  XR CHEST PA LAT  CT CHEST WO CONT  XR CHEST PA LAT  XR CHEST PA LAT  CT CHEST WO CONT  IP CONSULT TO NUTRITION SERVICES  DIET NUTRITIONAL SUPPLEMENTS  CONTACT ISOLATION    ECHO:  No results found for this visit on 02/23/17.    Lab:  Recent Labs      03/12/17   0400  03/11/17   0430   WBC  7.8  7.9   HGB  9.7*  9.2*   HCT  28.7*  27.3*   PLT  469*  432     Recent Labs      03/12/17   0400  03/11/17   0430   NA  136  136   K  4.5  4.8   CL  99  100   CO2  26  28   GLU  101  104   BUN  7  7   CREA  1.06  1.09   CA  8.2*  7.4*   MG  2.1  1.7*   ALB  1.8*  1.6*   SGOT  180*  177*   ALT  253*  223*      No components found for: GLPOC  No results for input(s): PH, PCO2, PO2, HCO3, FIO2 in the last 72 hours.  No results for input(s): INR in the last 72 hours.    No lab exists for component: Domingo DimesINREXT, INREXT, INREXT    Microbiology  All Micro Results     Procedure Component Value Units Date/Time    CULTURE, BLOOD [[id number]] Collected:  03/09/17 0100    Order  Status:  Completed Specimen:  Whole Blood from Blood Updated:  03/12/17 1010     Special Requests: NO SPECIAL REQUESTS        Culture result: NO GROWTH 3 DAYS       CULTURE, BLOOD [[id number]] Collected:  02/27/17 1830    Order Status:  Completed Specimen:  Blood from Blood Updated:  03/04/17 1017     Special Requests: NO SPECIAL REQUESTS        Culture result: NO GROWTH 5 DAYS       CULTURE, BLOOD [[id number]] Collected:  02/24/17 0430    Order Status:  Completed Specimen:  Whole Blood from Blood Updated:  03/02/17 1507     Special Requests: NO SPECIAL REQUESTS        Culture result: NO GROWTH 6 DAYS       CULTURE, BLOOD [[id number]]  (Abnormal)  (Susceptibility) Collected:  02/25/17 0900    Order Status:  Completed Specimen:  Whole Blood from Blood Updated:  02/28/17 1152     Special Requests: NO SPECIAL REQUESTS        GRAM STAIN         GRAM POS COCCI IN CLUSTERS AEROBIC BOTTLE CALLED RESULTS TO/VERIFIED READ BACK WITH NURSE TODD GREGORY AT 1255 ON 02/26/17 EXT.[id number]     Culture result:         **METHICILLIN RESISTANT STAPHYLOCOCCUS AUREUS** AEROBIC BOTTLE (A)              REMAINING BOTTLE(S) HAS/HAVE NO GROWTH SO FAR    CULTURE, RESPIRATORY/SPUTUM/BRONCH Gay Filler STAIN [[id number]]  (Abnormal)  (Susceptibility) Collected:  02/25/17 1930    Order Status:  Completed Specimen:  Sputum from Sputum Updated:  02/27/17 1232     Special Requests: NO SPECIAL REQUESTS        GRAM STAIN         MANY POLYMORPHONUCLEAR LEUKOCYTES (PMNs)              MODERATE GRAM POS COCCI IN CLUSTERS     Culture result:          HEAVY **METHICILLIN RESISTANT STAPHYLOCOCCUS AUREUS** PATIENT IS A KNOWN MRSA (A)              MODERATE ALPHA STREPTOCOCCUS, NOT S. PNEUMONIAE (A)    CULTURE, URINE [[id number]] Collected:  02/24/17 0630    Order Status:  Completed Specimen:  Urine from Foley Specimen Updated:  02/26/17 1237     Special Requests: NO SPECIAL REQUESTS        Culture result: NO GROWTH 48 HOURS             Urine Culture  Color   Date Value Ref Range Status   02/24/2017 YELLOW   Final     Appearance   Date Value Ref Range Status   02/24/2017 CLEAR   Final     Specific gravity   Date Value Ref Range Status   02/24/2017 1.028   Final     pH (UA)   Date Value Ref Range Status   02/24/2017 5.5 5.0 - 7.0   Final     Protein   Date Value Ref Range Status   02/24/2017 100 (A) NEG mg/dL Final     Ketone   Date Value Ref Range Status   02/24/2017 TRACE (A) NEG mg/dL Final     Bilirubin   Date Value Ref Range Status   02/24/2017 NEGATIVE  NEG   Final     Blood   Date Value Ref  Range Status   02/24/2017 MODERATE (A) NEG   Final     Urobilinogen   Date Value Ref Range Status   02/24/2017 1.0 0.2 - 1.0 EU/dL Final     Nitrites   Date Value Ref Range Status   02/24/2017 NEGATIVE  NEG   Final     Leukocyte Esterase   Date Value Ref Range Status   02/24/2017 NEGATIVE  NEG   Final       Urine Micro  WBC   Date Value Ref Range Status   02/24/2017 NEGATIVE  0 /hpf Final     RBC   Date Value Ref Range Status   02/24/2017 3-5 0 /hpf Final     Epithelial cells   Date Value Ref Range Status   02/24/2017 FEW /hpf Final     Bacteria   Date Value Ref Range Status   02/24/2017 FEW /hpf Final     Casts   Date Value Ref Range Status   02/24/2017 NONE SEEN /lpf Final     Crystals, urine   Date Value Ref Range Status   02/24/2017 NONE SEEN /LPF Final       Radiology Studies:  Xr Chest Pa Lat    Result Date: 03/07/2017  EXAM:  XR CHEST PA LAT dated 03/07/2017 11:29 AM INDICATION:   Follow up MRSA pneumonia COMPARISON: Chest AP x-ray 03/03/17 0800 hours FINDINGS: PA  and lateral radiographs of the chest.  Moderate consolidation posterior left lower lobe slightly increasing since previous examination.  Infiltrate right lower lung again noted. Sharply marginated line possible skinfold superimposed upon the right lung appearing since previous examination.  Right hemidiaphragm elevation.  Heart size is mildly enlarged.  Right-sided PICC line tip in the superior vena cava and right atrial junction in satisfactory position appearing since previous examination..     IMPRESSION: Consolidation posterior left lower lung slightly increasing since previous examination.  Right lower lung infiltrate unchanged. PICC line tip in superior vena cava and right atrial junction appearing since previous examination.     Xr Chest Pa Lat    Result Date: 03/03/2017  EXAM:  XR CHEST PA LAT dated 03/03/2017 8:12 AM INDICATION:   BL pneumonia with pain, follow up COMPARISON: 02/26/2017. FINDINGS: PA and lateral radiographs of the chest.  There are persistent bilateral lower lobe infiltrates. There are bilateral pleural effusions.     IMPRESSION: There are persistent bilateral lower lobe infiltrates. There are bilateral pleural effusions.     Xr Chest Pa Lat    Result Date: 02/26/2017  EXAM:  XR CHEST PA LAT dated 02/26/2017 10:31 AM INDICATION:   Persistent fever suspect empyema or abscess. COMPARISON: Chest AP semierect film from 02/24/2017.Chest CT scan 02/25/17. PA and lateral radiographs of the chest.  Dense infiltrate or consolidation in the posterior right midlung adjacent to the fissure in the superior segment right lower lobe measuring approximately 8.6 x 7.3 x 7.3 cm has  increased since previous examination 02/24/17.  Infiltrate left lower lobe has increased.  Moderate consolidation left lower lobe again noted Lungs are normally inflated.  Normal heart size..  Right hilar prominence compatible with small nodes unchanged. Marland Kitchen     IMPRESSION: Increasing consolidation posterior right midlung and  increasing infiltrate left lower lobe since previous examination 02/24/17.  Findings are unchanged since CT scan of 02/25/17. Dense consolidation left lower lobe measuring 4.9 cm in diameter, focal consolidation vs. Beginning of an abscess. Close follow-up examination suggested.  Some distention of colon with air  unchanged.     Xr Spine Lumb Min 4 V    Result Date: 02/25/2017  EXAM:  XR SPINE LUMB MIN 4 V dated 02/25/2017 12:28 PM INDICATION:   LBP COMPARISON:  None. FINDINGS:  AP, lateral, and oblique views of the lumbar spine were obtained. No fracture or subluxation. No disc space narrowing. No arthritic changes. No osteolytic or osteoblastic lesion. SI joints are unremarkable. There is opacification of both renal collecting system and ureters from the intravenous contrast injection of the prior CT.     IMPRESSION:   Normal exam.     Ct Chest Wo Cont    Result Date: 03/09/2017  EXAM: CT CHEST WO CONT dated 03/09/2017 11:33 AM INDICATION:  Follow up CT and fever and LLL pain persisted COMPARISON:  02/28/2017 TECHNIQUE: Chest CT without contrast 2.5 mm axial images with sagittal and coronal reconstructions produced. One or more of the following dose reduction techniques were used: automated exposure control, adjustment of the mA and/or kV according to patient size, use of iterative reconstruction technique. FINDINGS:  Limited evaluation of solid viscera, mediastinum and vessels due to the lack of contrast. Included thyroid appears homogenous. Right-sided vascular catheter with tip in SVC. Heart is nonenlarged. Trace nonspecific pericardial effusion. Minimal vascular calcifications. Cannot adequately evaluate the pulmonary arteries. Slightly prominent mediastinal lymph nodes may be reactive. Apical interstitial disease with honeycombing, septal thickening and subpleural cysts. Similar appearance on prior. Redemonstrated is extensive consolidation within the lower lobes  bilaterally with scattered pneumatoceles. Overall slight improvement since prior. Tiny right pleural effusion is new. Moderate left pleural effusion with loculated components extending into the left upper lobe and major fissure. Limited evaluation of the upper abdomen demonstrates no interval change. Somewhat dense appearance of the bones. This is nonspecific but may be seen with underlying metabolic disease     IMPRESSION:  1.  Heart is not enlarged. Stable small pericardial effusion. 2.  Redemonstrated is extensive consolidation within the lower lobes bilaterally with scattered pneumatoceles. Overall slight improvement since prior. 3.  Tiny right pleural effusion is new. 4.  There a larger moderate left pleural effusion with loculated components extending into the major fissure and left upper lobe. Probably infective or exudative. 5.  Remainder of the exam is unchanged. 6.  See discussion above     Ct Chest Wo Cont    Result Date: 02/28/2017  EXAM:  CT CHEST WO CONT  dated 02/28/2017 2:38 PM INDICATION:  MRSA pneumonia worsening suspect abscess formation COMPARISON: CT scan the chest with contrast injection 02/25/17.  CONTRAST:  None. TECHNIQUE: Spiral CT through the thorax without contrast was performed. 2.5 mm axial images with sagittal and coronal reconstructions produced. CT dose reduction was achieved through use of a standardized protocol tailored for this examination and automatic exposure control for dose modulation. The absence of intravenous contrast prevents evaluation for pulmonary emboli or aortic dissection. FINDINGS:Small bulla in the left and right upper lobes.  Focal infiltrate in the anterior part of the left upper lobe unchanged since the previous examination. Bilateral basilar consolidation again noted.  On the right side it measures approximately 10 cm in craniocaudal dimension unchanged since the previous examination.  On the  left side it measures approximately 10 cm , increasing from approximately 9 cm on the previous study.  No significant pleural effusion or empyema detected at this time.  No cavitary lesions other than small bulla within the lung fields.\\ 7.1 mm anterior inferior pericardial effusion again noted.  Right hemidiaphragm elevation.  Heart size is normal.     IMPRESSION: Bibasilar consolidation in the lung fields left side slightly larger again noted with small infiltrate left upper lobe and bullous changes the left and right lung apices. No cavitary lesion or loculated pleural effusion detected on the current examination.  Trace pericardial effusion measuring 7.1 mm appearing since previous examination.  Right hemidiaphragm elevation.     Ct Chest W Cont    Result Date: 02/25/2017  CT CHEST W CONT dated 02/25/2017 12:19 PM INDICATION:  Pneumonia, Hematemesis COMPARISON: None TECHNIQUE: Spiral CT with IV contrast bolus through the thorax was performed. 2.5 mm axial images with sagittal and coronal reconstructions produced. Contrast material 100 mL Isovue 300. One or more of the following dose reduction techniques were used: automated exposure control, adjustment of the mA and/or kV according to patient size, use of iterative reconstruction technique. INTERPRETATION:  There is less than optimal opacification of the pulmonary arterial tree. No definite pulmonary emboli can be seen. The aorta is unremarkable. There are dense consolidative infiltrates in both lower lobes, and there is patchy infiltration in the left upper lobe. No definite pleural effusion is seen. Multiple air bronchograms are seen in the lower lobes. No hilar or mediastinal mass is identified. The tracheobronchial tree is unremarkable. The thyroid is unremarkable. No cervical lymphadenopathy is seen. No chest wall abnormality is observed. Limited images the upper abdomen show no significant abnormality. The visualized skeletal structures are unremarkable.      IMPRESSION: 1.  Dense bilateral lower lobe infiltrates consistent with pneumonia. There is also patchy infiltration in the left upper lobe. 2.  Suboptimal opacification of the pulmonary arterial tree. No gross pulmonary embolic disease is observed.    Xr Chest Port    Result Date: 02/24/2017  EXAM:  XR CHEST PORT dated 02/24/2017 8:31 AM INDICATION:  pre op. Nurses note states patient coughing up blood-tinged sputum. COMPARISON:  None. FINDINGS: A portable AP semierect radiograph of the chest was obtained at 0819 hours. There are bilateral lower lung zone infiltrates slightly more prominent on the left than the right. There is no obscuration of the diaphragm or of the heart border, and localization of the infiltrates is difficult. Cannot rule out overlying pleural disease. The upper lung zones are clear. Heart size is normal. No hilar or mediastinal mass.     IMPRESSION:  Bilateral lower lung zone densities consistent with bilateral infiltrates. Consider conventional PA and lateral views for better characterization.       Isolation:   Contact    Lines:   Central Line    Allergy:   No Known Allergies    Recommended Diet:   Regular Diet    Recommended Activity:   Activity as tolerated    Hospital Course:  Bartley Vuolo is a 41 y.o. year-old BLACK [place] male with a complicated past medical history mentioned above??was sent to ER for evaluation of dry cough, CP and feverof two day duration on 02/24/2017. As per patient, he flu like symptoms the last few days. He admitted taking his vaccination. He started coughing more and was productive and the phlegm was bloody. He was sent to [facility]. CT scan showed diffuse BL pneumonia. Rapid influenza test was +. 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 He was started on Tamiflu, Vanco, Clinda AND IV fluid. Blood and sputum culture grew MRSA. ECHO was negative. Despite treatment his WBC rose from 8.9 to  21  K in 3 days. He was not on steroid. CT scan was repeated and showed LLL infiltrate worsening. Vanco was discontinued and was started on Linizolid on 02/27/2017. WBC steadily decreased to 7.8. Repeat blood culture was negative on the second day of treatment. Fever was trending down and 3 days ago all of a sudden pain and fever got worse. CT scan was repeated and showed new Left side effusion. As we do not have IR or thoracic surgery, he is going to be transferred to [facility] today. Patient has been informed about the possibility of Lung abscess or empyema complications. He is informed that as he has underlying emphysema already, he may have some structural deterioration of hi lung and may show as functional or breathing impairment. PT was in progress with minimal NC O2 requirement of 2 lit/min saturating 94-97 %. During his stay he complained of sever back pain with no radiation. X\\LS X-ray was negative. As the pain subsided on it own, no further study was done and there was no any neurological issues noted. At time he gets very bligerant and had issues with nursing staff and officers as well. His albumin went down as low as 1.5 and now has increased to 1.8 the last few days. As ALT increased the last few days, Clindamycin is on hold. HIV and Hepatitis tests are all negative. He had mild HTN and was started on Clonidine to also help him on his behavioral issues.    A comprehensive review of systems was negative except for that written in the HPI.     Physical Exam:   Patient Vitals for the past 12 hrs:   Temp Pulse Resp BP SpO2   03/12/17 1245 98.9 ??F (37.2 ??C) 80 18 107/68 99 %   03/12/17 0737 99.1 ??F (37.3 ??C) 84 20 105/73 96 %   03/12/17 0400 99.2 ??F (37.3 ??C) 78 20 108/67 94 %       GENERAL: In no acute distress, comfortable, lying in bed.   SKIN: Warm, moist. Normal skin turgor. No rash or scratch mark noted.   HEENT: Normocephalic, Atraumatic,   Pink conjunctiva, Anicteric, EOMI,    No sinus tenderness or stuffy nose and   Clear & moist oral mucosa.   NECK: Supple, no JVD, no thyroidmegammy or lymphadenopathy.   AXILLA: No Lymphadenopathy.   LUNGS: Symmetrical, decrease BL LL base L>R   CARDIAC: S1 & S2, regular rate and rhythmn. No murmurs, rubs or gallops heard   ABDOMEN: Moves with respiration. Soft, NT, BS +, & no mass or organomegaly.   INGUINAL: No hernia or lymphadenopathy.   GUS: Genitals grossly intact. No supra pubic area tenderness. No CVAT.   EXTREM.: No leg edema and distal pulses intact on BL DP.  NEURO: AOX3, No obvious focal deficits. Sensation and strength grossly intact.   No astexis, Clonus or Babinski noted.     CODE STATUS:  Full code.    I have discussed the diagnosis and the treatment plan as well as the prognosis with the patient. I have discussed the type and duration of antibiotics to be administered. Potential side effects of these antibiotics like C. Diff colitis has been discussed with the patient as well.   Advised to be compliant with meds and follow ups.   Risks including death has been explained.   It is important that you take the medication exactly as they are prescribed.   Do not take other medications without consulting your doctor.   If you  experience any worsening of your symptoms, please follow up with PMD  ASAP.      Signed By: Halina Andreas, MD     March 12, 2017

## 2017-03-14 LAB — CULTURE, BLOOD: Culture result:: NO GROWTH

## 2017-05-05 IMAGING — CT CT ABD-PELV W/ CM
2 of 8 series · 13 of 46 positions shown, 18 images · IV contrast (APPLIED)
Comparison: None

CLINICAL DATA: 39-year-old male with nausea and vomiting and
chills. Diffuse abdominal pain.

EXAM:
CT ABDOMEN AND PELVIS WITH CONTRAST
TECHNIQUE: Multidetector CT imaging of the abdomen and pelvis was performed
using the standard protocol following bolus administration of
intravenous contrast.
CONTRAST:  100mL OMNIPAQUE IOHEXOL 300 MG/ML  SOLN

[Series 2: abd/ pelvis 5.0 i30f 1 · axial · 0.69mm/px · z∈[-591,-171]mm · 10 of 98 slices shown, 15 images]
[im 7/98  soft-tissue]
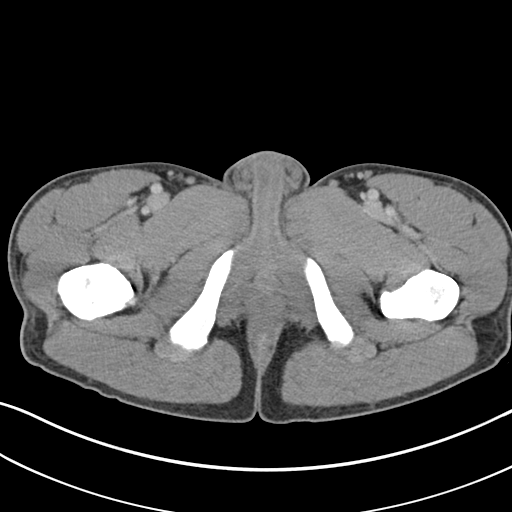
[im 7/98  bone]
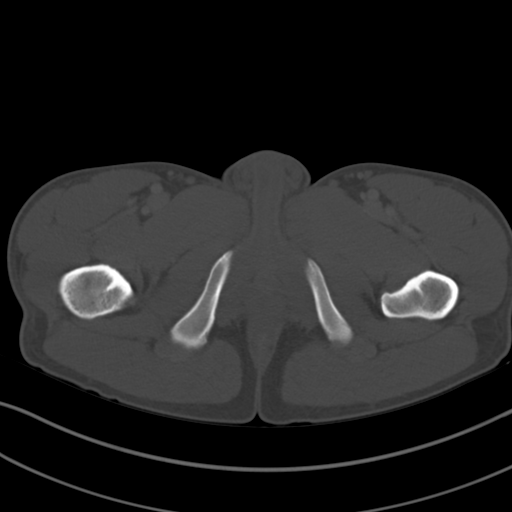
[im 21/98  soft-tissue]
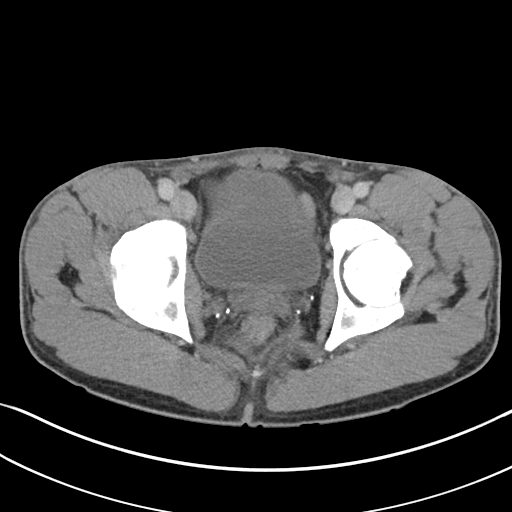
[im 28/98  soft-tissue]
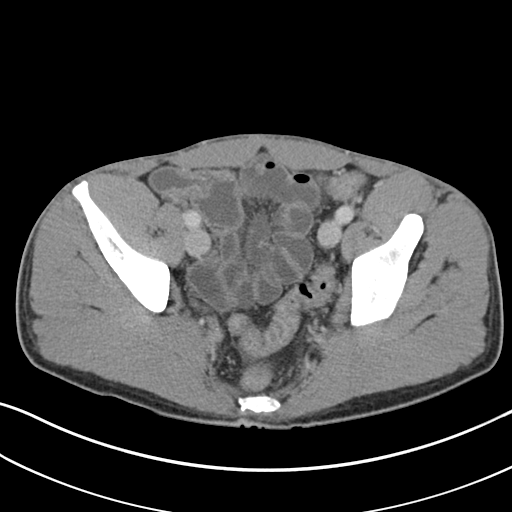
[im 42/98  soft-tissue]
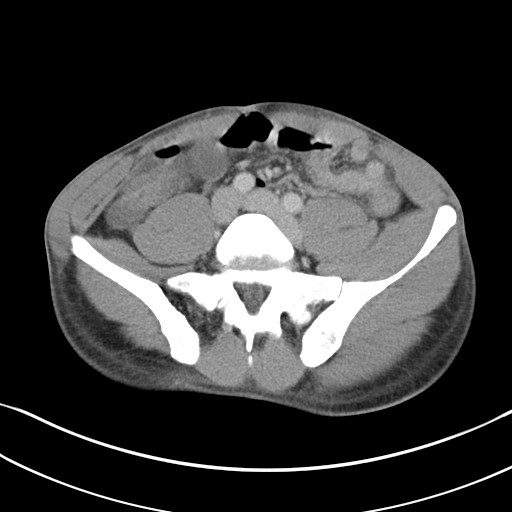
[im 49/98  soft-tissue]
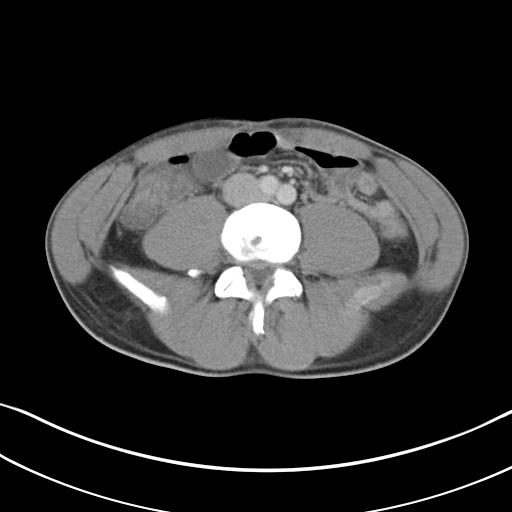
[im 56/98  soft-tissue]
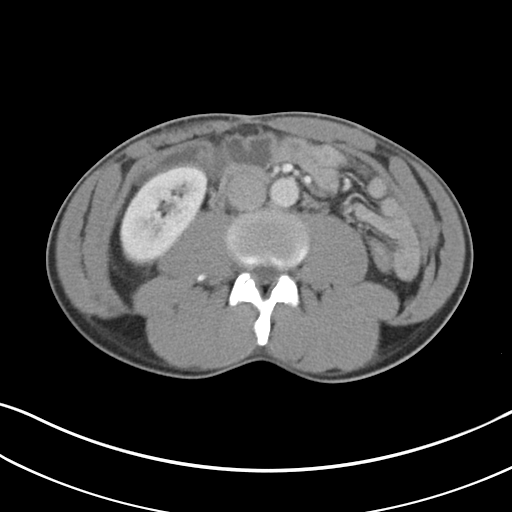
[im 70/98  soft-tissue]
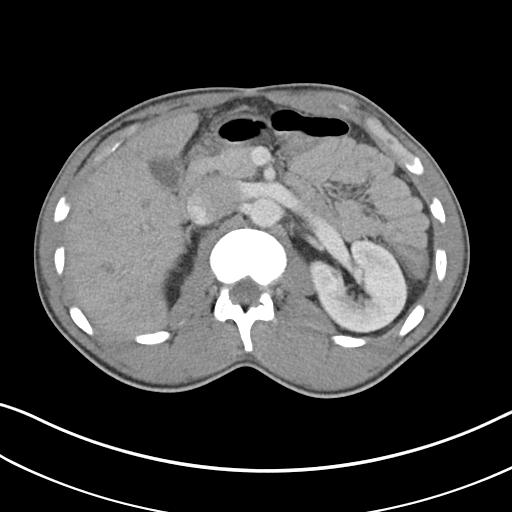
[im 70/98  lung]
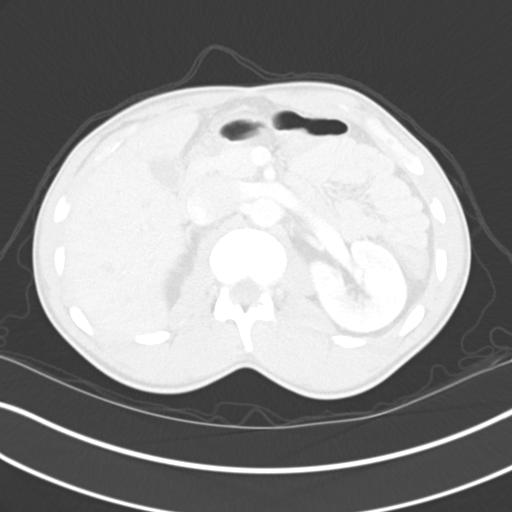
[im 77/98  soft-tissue]
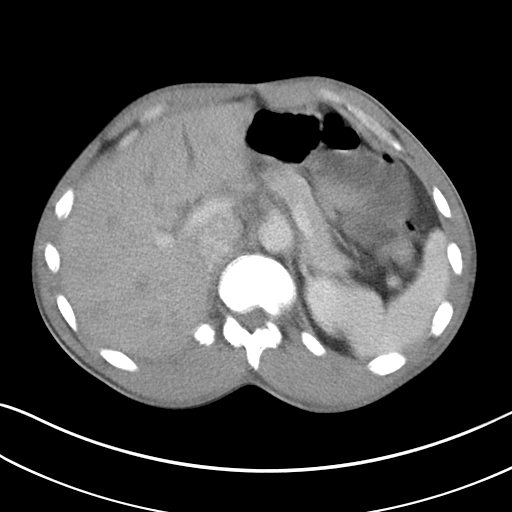
[im 77/98  lung]
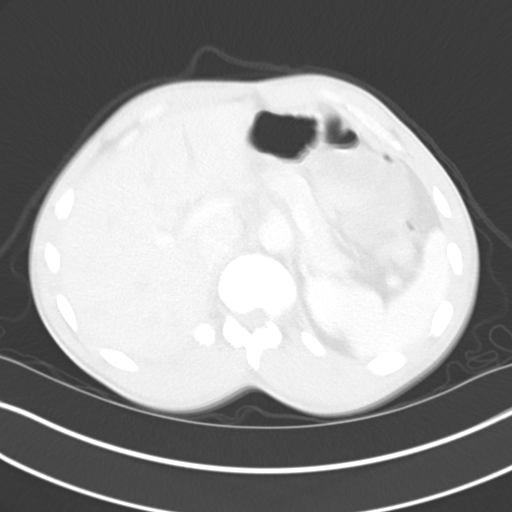
[im 84/98  lung]
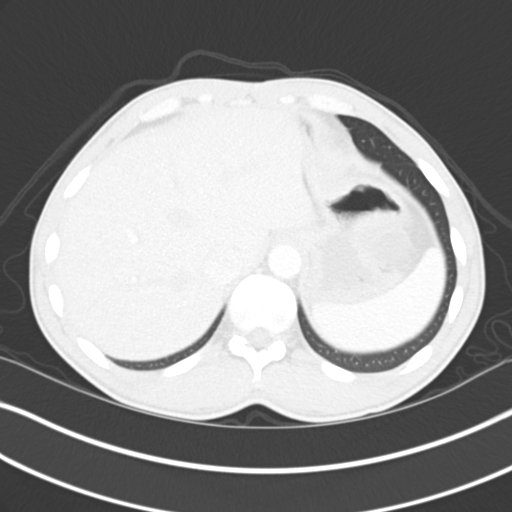
[im 91/98  soft-tissue]
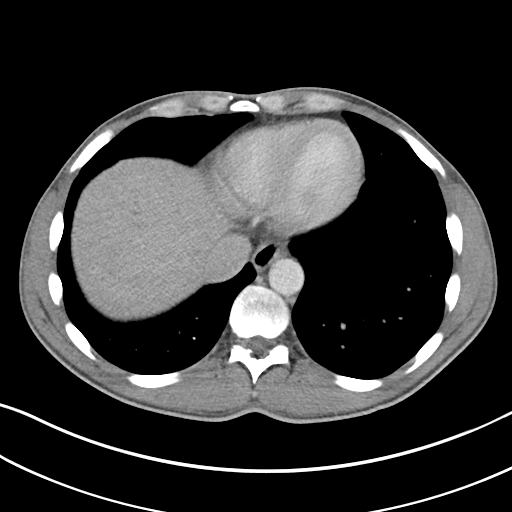
[im 91/98  lung]
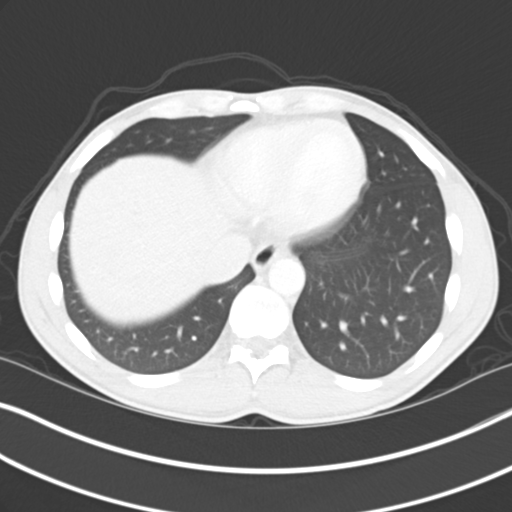
[im 91/98  bone]
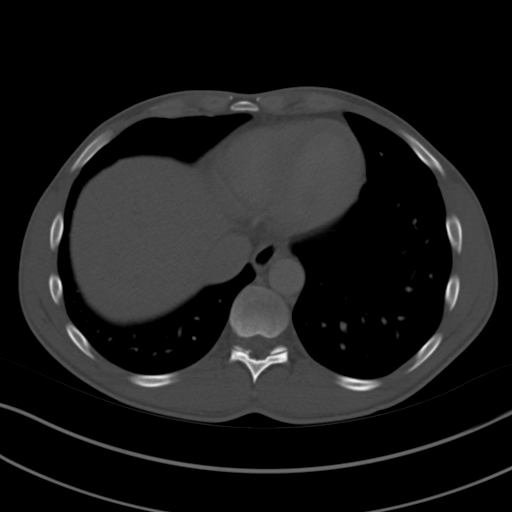

[Series 5: coronal soft tissue · coronal · 0.73mm/px · 3 of 79 slices shown]
[im 20/79  soft-tissue]
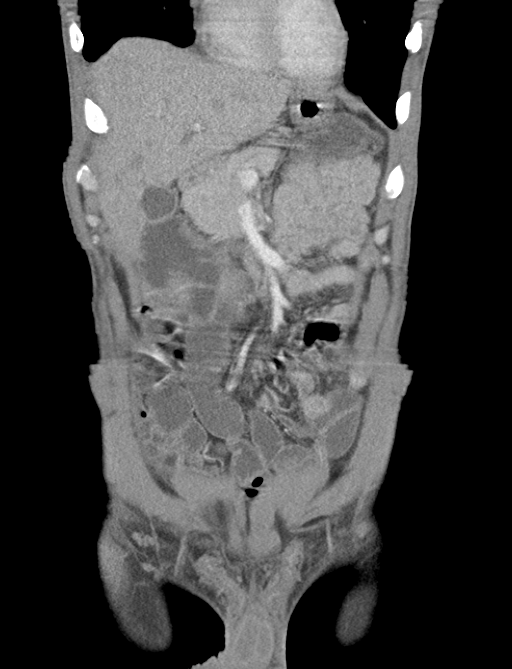
[im 40/79  soft-tissue]
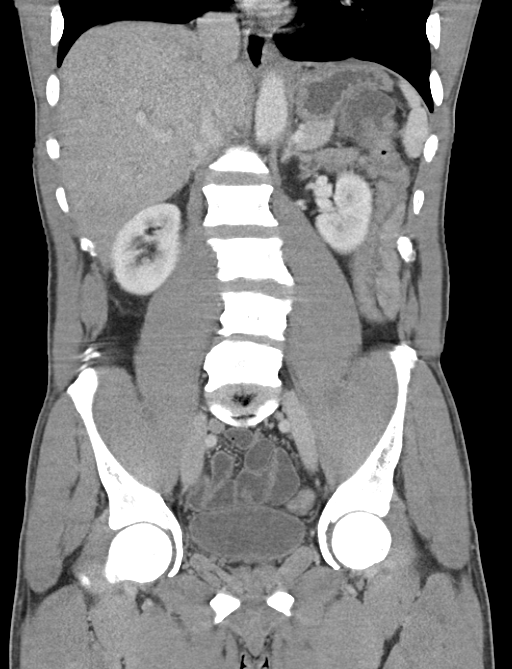
[im 59/79  soft-tissue]
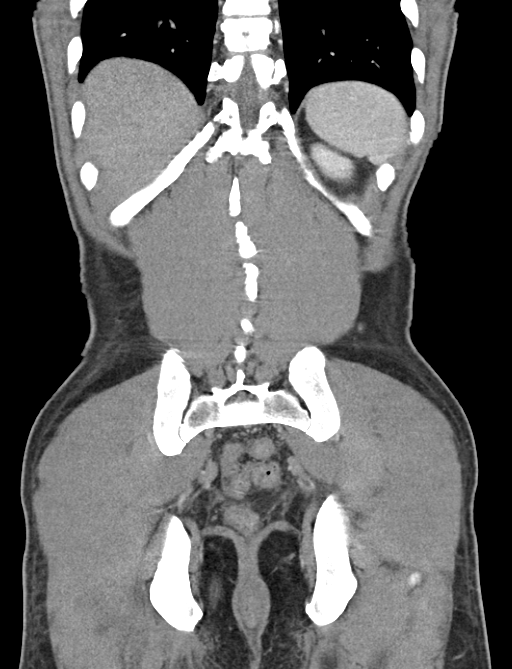

[13 of 46 positions shown; findings below may reference images not displayed]

FINDINGS: Evaluation of this exam is limited due to respiratory motion
artifact.

The visualized lung bases are clear. No intra-abdominal free air or
free fluid.

The liver, gallbladder, pancreas, spleen, adrenal glands, kidneys,
visualized ureters, and urinary bladder appear unremarkable. The
prostate and seminal vesicles are grossly unremarkable.

Loose stool noted within the colon compatible with diarrheal state.
Correlation with clinical exam and stool cultures recommended.
Multiple normal caliber fluid-filled loops of small bowel noted
which may be physiologic or represent enteritis. No evidence of
bowel obstruction. Normal appendix.

The abdominal aorta and IVC appear unremarkable. No portal venous
gas identified. There is no adenopathy. The abdominal wall soft
tissues and the osseous structures appear unremarkable.
IMPRESSION: Possible enteritis with diarrheal state. Correlation with clinical
exam and stool cultures recommended. No evidence of bowel
obstruction. Normal appendix.
